# Patient Record
Sex: Female | Born: 1955 | Race: White | Hispanic: No | Smoking: Former smoker
Health system: Southern US, Community
[De-identification: ages and names within clinical notes are randomized; demographics above are authoritative.]

## PROBLEM LIST (undated history)

## (undated) DIAGNOSIS — R053 Chronic cough: Secondary | ICD-10-CM

## (undated) DIAGNOSIS — E049 Nontoxic goiter, unspecified: Secondary | ICD-10-CM

## (undated) DIAGNOSIS — N3090 Cystitis, unspecified without hematuria: Secondary | ICD-10-CM

## (undated) DIAGNOSIS — K219 Gastro-esophageal reflux disease without esophagitis: Secondary | ICD-10-CM

## (undated) HISTORY — DX: Cystitis, unspecified without hematuria: N30.90

## (undated) HISTORY — PX: NO PAST SURGERIES: SHX2092

## (undated) HISTORY — DX: Chronic cough: R05.3

## (undated) HISTORY — DX: Nontoxic goiter, unspecified: E04.9

## (undated) HISTORY — DX: Gastro-esophageal reflux disease without esophagitis: K21.9

---

## 2010-07-29 ENCOUNTER — Encounter (INDEPENDENT_AMBULATORY_CARE_PROVIDER_SITE_OTHER): Payer: Self-pay | Admitting: *Deleted

## 2010-12-01 NOTE — Letter (Signed)
Summary: Recall Office Visit  [facility]  [address]   [place], [place] [address]   Phone: [phone]  Fax: [phone]      July 29, 2010   MELINDA GWINNER [address] [facility] [address] [place]  [address] [birth year]   Dear Ms. Whittaker,   We have made several attempts to reach you by phone but have not been able to do so.   At your convenience, please call [phone] to schedule an office visit. If you have any questions, concerns, or feel that this letter is in error, we would appreciate your call.   Sincerely,    Rexene Alberts  [facility] Ph: [phone]   Fax: [phone]

## 2011-02-08 ENCOUNTER — Emergency Department (HOSPITAL_COMMUNITY): Payer: Self-pay

## 2011-02-08 ENCOUNTER — Emergency Department (HOSPITAL_COMMUNITY)
Admission: EM | Admit: 2011-02-08 | Discharge: 2011-02-08 | Disposition: A | Discharge status: Home / Self Care | Payer: Self-pay | Attending: Emergency Medicine | Admitting: Emergency Medicine

## 2011-02-08 DIAGNOSIS — M533 Sacrococcygeal disorders, not elsewhere classified: Secondary | ICD-10-CM | POA: Insufficient documentation

## 2011-02-08 DIAGNOSIS — R300 Dysuria: Secondary | ICD-10-CM | POA: Insufficient documentation

## 2011-02-08 LAB — URINALYSIS, ROUTINE W REFLEX MICROSCOPIC
Glucose, UA: NEGATIVE mg/dL
Hgb urine dipstick: NEGATIVE
Ketones, ur: NEGATIVE mg/dL
Protein, ur: NEGATIVE mg/dL
pH: 5.5 (ref 5.0–8.0)

## 2011-02-10 ENCOUNTER — Encounter: Payer: Self-pay | Admitting: Gastroenterology

## 2011-02-10 ENCOUNTER — Ambulatory Visit (INDEPENDENT_AMBULATORY_CARE_PROVIDER_SITE_OTHER): Payer: Self-pay | Admitting: Gastroenterology

## 2011-02-10 VITALS — BP 144/92 | HR 78 | Temp 98.5°F | Ht 65.0 in | Wt 180.0 lb

## 2011-02-10 DIAGNOSIS — R109 Unspecified abdominal pain: Secondary | ICD-10-CM

## 2011-02-10 NOTE — Patient Instructions (Signed)
Complete CT scan; we will call you with results Complete bloodwork; we will call you with these results as well Continue High Fiber diet We highly recommend a screening colonoscopy in the near future Further recommendations after these are completed.

## 2011-02-10 NOTE — Progress Notes (Signed)
Referring Provider: Terie Purser, PA Primary Care Physician:  Terie Purser, [place], [place] Primary Gastroenterologist:  Dr. Jena Gauss   Chief Complaint  Patient presents with  . Abdominal Pain    HPI:  Jaime Owens is a 55 y.o. female here as a referral from [place], [place], secondary to abdominal pain. She reports original onset in July, lower abdominal pain, given abx for bladder infection (Cipro). Then, next episode was LLQ discomfort, constant, given Cipro and Flagyl. She noticed improvement with this. Now she states she has rare lower abdominal discomfort, but more of a sensation of "swelling" and fullness anterior to tailbone, inside. Has to sit sideways due to pain and discomfort. Described as constant, achy, and sore. Not stabbing. Afebrile. Denies any burning or itching of rectum. One remote episode of brbpr. Takes metamucil daily, has BM at least twice/day. Through all of these symptoms, no change in bowel habits. No lack of appetite, wt loss. No prior colonoscopy, and she is not interested in pursuing one at this moment.  Past Medical History  Diagnosis Date  . Bladder infection     Surgical Hx: NONE  Current Outpatient Prescriptions  Medication Sig Dispense Refill  . Acetaminophen (TYLENOL ARTHRITIS EXT RELIEF PO) Take 2 capsules by mouth as needed.        . Alfalfa TABS Take 860 tablets by mouth 2 (two) times daily.        . ALPHA LIPOIC ACID PO Take 200 mg by mouth 2 (two) times daily.        . B Complex-C (SUPER B COMPLEX PO) Take by mouth daily.        Marland Kitchen BLACK COHOSH PO Take 150 mg by mouth daily.        . ciprofloxacin (CIPRO) 500 MG tablet Take 500 mg by mouth 2 (two) times daily.        . diphenhydrAMINE (BENADRYL) 25 MG tablet Take 25 mg by mouth every 6 (six) hours as needed.        . Ginger, Zingiber officinalis, (GINGER ROOT) 550 MG CAPS Take 550 mg by mouth 4 (four) times daily.        . Ginkgo Biloba 60 MG CAPS Take 1 capsule by mouth 2 (two) times daily.         . Glucosamine-Chondroitin-MSM 375-300-250 MG TABS Take 1 capsule by mouth 3 (three) times daily.        . LUTEIN-BILBERRY PO Take by mouth 4 (four) times daily.        Marland Kitchen MILK THISTLE PO Take 1 capsule by mouth 4 (four) times daily.        . Multiple Vitamin (MULTIVITAMIN) capsule Take 2 capsules by mouth daily.        . Nettle, Urtica Dioica, (NETTLE LEAF PO) Take 1 capsule by mouth 2 (two) times daily.        . psyllium (METAMUCIL) 58.6 % powder Take 1 packet by mouth 3 (three) times daily.        . Valerian 400 MG CAPS Take 2 capsules by mouth at bedtime as needed.          Allergies as of 02/10/2011 - never reviewed  Allergen Reaction Noted  . Codeine  02/10/2011  . Flagyl (metronidazole hcl)  02/10/2011  . Penicillins  02/10/2011    Family History  Problem Relation Age of Onset  . Diabetes Father     deceased   . Diverticulosis Mother   . Colon cancer Paternal Aunt  History   Social History  . Marital Status: Divorced    Spouse Name: N/A    Number of Children: 2  . Years of Education: N/A   Occupational History  . Not on file.   Social History Main Topics  . Smoking status: Never Smoker   . Smokeless tobacco: Not on file  . Alcohol Use: No  . Drug Use: Not on file  . Sexually Active: Not on file     Review of Systems: Gen: Denies any fever, chills, sweats, anorexia, fatigue, weakness, malaise, weight loss, and sleep disorder CV: Denies chest pain, angina, palpitations, syncope, orthopnea, PND, peripheral edema, and claudication. Resp: Denies dyspnea at rest, dyspnea with exercise, cough, sputum, wheezing, coughing up blood, and pleurisy. GI: See HPI GU : Denies urinary burning, blood in urine, urinary frequency, urinary hesitancy, nocturnal urination, and urinary incontinence. MS: Denies joint pain, limitation of movement, and swelling, stiffness, low back pain, extremity pain. Denies muscle weakness, cramps, atrophy.  Derm: Denies rash, itching, dry  skin, hives, moles, warts, or unhealing ulcers.  Psych: Denies depression, anxiety, memory loss, suicidal ideation, hallucinations, paranoia, and confusion. Heme: Denies bruising, bleeding, and enlarged lymph nodes.  Physical Exam: BP 144/92  Pulse 78  Temp(Src) 98.5 F (36.9 C) (Oral)  Ht 5\' 5"  (1.651 m)  Wt 180 lb (81.647 kg)  BMI 29.95 kg/m2 General:   Alert,  Well-developed, well-nourished, pleasant and cooperative in NAD Head:  Normocephalic and atraumatic. Eyes:  Sclera clear, no icterus.   Conjunctiva pink. Ears:  Normal auditory acuity. Nose:  No deformity, discharge,  or lesions. Mouth:  No deformity or lesions, dentition normal. Neck:  Supple; no masses or thyromegaly. Lungs:  Clear throughout to auscultation.   No wheezes, crackles, or rhonchi. No acute distress. Heart:  Regular rate and rhythm; no murmurs, clicks, rubs,  or gallops. Abdomen:  Soft, nontender and nondistended. No masses, hepatosplenomegaly or hernias noted. Normal bowel sounds, without guarding, and without rebound.   Msk:  Symmetrical without gross deformities. Normal posture. Extremities:  Without clubbing or edema. Neurologic:  Alert and  oriented x4;  grossly normal neurologically. Skin:  Intact without significant lesions or rashes. Cervical Nodes:  No significant cervical adenopathy. Psych:  Alert and cooperative. Normal mood and affect.

## 2011-02-10 NOTE — Assessment & Plan Note (Addendum)
55 year old Caucasian female, very pleasant and quite healthy, who presents with vague abdominal and "tailbone" discomfort. Originally, lower abdominal and LLQ discomfort that was treated as bladder infection originally, then upon second occurrence, as possible diverticulitis. Resolved with po abx. Pt now c/o tailbone discomfort, inability to sit for long periods of time without discomfort. Not described as rectally, more internally. Remote episode of brbpr. No change in bowel habits. Afebrile. May very well have had episode of diverticulitis in past that was not radiologically documented; however, symptoms now vague and not congruent with a repeat episode. Pt has never had a colonoscopy, and she does not want to pursue this at this time. She was counseled on the importance, but she would still rather abstain for now. I discussed obtaining a CBC to assess for any anemia, leukocytosis, as well as CT to assess for possible etiology. She is agreeable to this. Further rec's to follow.   CBC CT abd/pelvis Pt would benefit from screening colonoscopy in near future

## 2011-02-11 ENCOUNTER — Ambulatory Visit (HOSPITAL_COMMUNITY)
Admission: RE | Admit: 2011-02-11 | Discharge: 2011-02-11 | Disposition: A | Discharge status: Home / Self Care | Payer: Self-pay | Source: Ambulatory Visit | Attending: Gastroenterology | Admitting: Gastroenterology

## 2011-02-11 ENCOUNTER — Inpatient Hospital Stay (HOSPITAL_COMMUNITY): Admission: RE | Admit: 2011-02-11 | Payer: Self-pay | Source: Ambulatory Visit

## 2011-02-11 ENCOUNTER — Encounter (HOSPITAL_COMMUNITY): Payer: Self-pay

## 2011-02-11 DIAGNOSIS — R109 Unspecified abdominal pain: Secondary | ICD-10-CM | POA: Insufficient documentation

## 2011-02-11 DIAGNOSIS — K7689 Other specified diseases of liver: Secondary | ICD-10-CM | POA: Insufficient documentation

## 2011-02-11 DIAGNOSIS — K5730 Diverticulosis of large intestine without perforation or abscess without bleeding: Secondary | ICD-10-CM | POA: Insufficient documentation

## 2011-02-11 LAB — CBC WITH DIFFERENTIAL/PLATELET
Basophils Absolute: 0 10*3/uL (ref 0.0–0.1)
Eosinophils Relative: 2 % (ref 0–5)
HCT: 43.9 % (ref 36.0–46.0)
Hemoglobin: 14.2 g/dL (ref 12.0–15.0)
Lymphocytes Relative: 40 % (ref 12–46)
MCV: 89.6 fL (ref 78.0–100.0)
Monocytes Absolute: 0.5 10*3/uL (ref 0.1–1.0)
Monocytes Relative: 7 % (ref 3–12)
RDW: 12.7 % (ref 11.5–15.5)
WBC: 7 10*3/uL (ref 4.0–10.5)

## 2011-02-11 MED ORDER — IOHEXOL 300 MG/ML  SOLN
100.0000 mL | Freq: Once | INTRAMUSCULAR | Status: AC | PRN
  Administered 2011-02-11: 100 mL via INTRAVENOUS

## 2011-02-11 NOTE — Progress Notes (Signed)
Reviewed by R. Michael Hershel Corkery, MD FACP FACG 

## 2011-02-11 NOTE — Progress Notes (Signed)
Reviewed by R. Michael Shiza Thelen, MD FACP FACG 

## 2011-02-16 ENCOUNTER — Other Ambulatory Visit: Payer: Self-pay | Admitting: Gastroenterology

## 2011-02-16 ENCOUNTER — Telehealth: Payer: Self-pay

## 2011-02-16 DIAGNOSIS — R109 Unspecified abdominal pain: Secondary | ICD-10-CM

## 2011-02-16 DIAGNOSIS — K769 Liver disease, unspecified: Secondary | ICD-10-CM

## 2011-02-16 MED ORDER — PEG 3350-KCL-NA BICARB-NACL 420 G PO SOLR: ORAL | Status: AC

## 2011-02-16 NOTE — Telephone Encounter (Signed)
Pt returned call today. She has agreed to have MRI and TCS done per [facility] orders. Please call pt to set up.

## 2011-02-16 NOTE — Progress Notes (Signed)
Pt is scheduled for MRI- 02/18/11 and TCS on 03/02/11- She is aware of both.

## 2011-02-17 NOTE — Progress Notes (Signed)
Rx called to [facility] @ [facility] .

## 2011-02-19 ENCOUNTER — Ambulatory Visit (HOSPITAL_COMMUNITY)
Admission: RE | Admit: 2011-02-19 | Discharge: 2011-02-19 | Disposition: A | Discharge status: Home / Self Care | Payer: Self-pay | Source: Ambulatory Visit | Attending: Gastroenterology | Admitting: Gastroenterology

## 2011-02-19 DIAGNOSIS — K769 Liver disease, unspecified: Secondary | ICD-10-CM

## 2011-02-19 DIAGNOSIS — K7689 Other specified diseases of liver: Secondary | ICD-10-CM | POA: Insufficient documentation

## 2011-02-19 MED ORDER — GADOBENATE DIMEGLUMINE 529 MG/ML IV SOLN: 17.0000 mL | Freq: Once | INTRAVENOUS | Status: AC | PRN

## 2011-02-25 NOTE — Progress Notes (Signed)
Reminder in epic to repeat MRI in one year

## 2011-03-01 ENCOUNTER — Encounter: Payer: Self-pay | Admitting: Gastroenterology

## 2011-03-02 ENCOUNTER — Encounter: Payer: Self-pay | Admitting: Gastroenterology

## 2011-03-02 ENCOUNTER — Ambulatory Visit (HOSPITAL_COMMUNITY)
Admission: RE | Admit: 2011-03-02 | Discharge: 2011-03-02 | Disposition: A | Discharge status: Home / Self Care | Payer: Self-pay | Source: Ambulatory Visit | Attending: Gastroenterology | Admitting: Gastroenterology

## 2011-03-02 DIAGNOSIS — R1032 Left lower quadrant pain: Secondary | ICD-10-CM

## 2011-03-02 DIAGNOSIS — K648 Other hemorrhoids: Secondary | ICD-10-CM | POA: Insufficient documentation

## 2011-03-18 ENCOUNTER — Telehealth: Payer: Self-pay

## 2011-03-18 NOTE — Telephone Encounter (Signed)
Pt called to say Thanks to Dr. Darrick Penna for sending her the info she requested. She said she reviewed the operative report and would like to let Dr. Darrick Penna know that there were a few errors in the report.  1. In indication for procedure she presented with left lower quadrant abdominal pain and tailbone pain...not the elbow pain. 2. Regarding procedure technique, she was not sedated. She refused, she wanted to know what was going on.   Said she just wanted these notes added to her chart.

## 2011-03-19 NOTE — Telephone Encounter (Signed)
Please call pt. She has a preliminary report and all reports are reviewed for accuracy because they go through a transcription service. Her report has not been signed yet.

## 2011-03-25 NOTE — Telephone Encounter (Signed)
LMOM for a call.  

## 2011-03-26 NOTE — Telephone Encounter (Signed)
Pt was informed that she got a preliminary report, she would like to know when she needs to go back to get the corrected report. Please advise!

## 2011-03-30 NOTE — Telephone Encounter (Signed)
LMOM to call.

## 2011-03-30 NOTE — Op Note (Signed)
  Jaime Owens, Jaime Owens              ACCOUNT NO.:  [id number]  MEDICAL RECORD NO.:  [id number]           PATIENT TYPE:  O  LOCATION:  DAYP                          FACILITY:  [facility]  PHYSICIAN:  Jonette Eva, M.D.     DATE OF BIRTH:  1956-09-01  DATE OF PROCEDURE:  03/02/2011 DATE OF DISCHARGE:                              OPERATIVE REPORT   REFERRING PHYSICIAN:  Terie Purser, [facility].  PROCEDURE:  Colonoscopy.  INDICATION FOR EXAM:  Ms. Raben is a 55 year old female who presents for average risk colon cancer screening.  FINDINGS: 1. Normal colon without evidence of polyps, masses, inflammatory     changes, diverticula, or AVMs.  An 11-minute cecal withdrawal time. 2. Moderate internal hemorrhoids.  Otherwise, normal retroflex view of     the rectum.  RECOMMENDATIONS: 1. Screening colonoscopy in 10 years. 2. She should follow a high-fiber diet.  She was given a handout on     high-fiber diet and hemorrhoids.  MEDICATIONS:  None per the patient's request.  PROCEDURE TECHNIQUE:  Physical exam was performed.  Informed consent was obtained from the patient after explaining the benefits, risks, and alternatives to the procedure.  The patient was connected to the monitor and placed in the left lateral position.  Continuous oxygen was provided by nasal cannula and  NO IV medicine was administered through an indwelling cannula.  After rectal exam, the patient's rectum was intubated and the scope was advanced under direct visualization to the cecum.  The scope was removed slowly by carefully examining the color, texture, anatomy, and integrity of the mucosa on the way out. The patient was recovered in an endoscopy and discharged home in satisfactory condition.     Jonette Eva, M.D.     SF/MEDQ  D:  03/02/2011  T:  03/03/2011  Job:  [id number]  cc:   Terie Purser [facility]  Electronically Signed by Jonette Eva M.D. on 03/30/2011  02:47:26 PM

## 2011-03-30 NOTE — Telephone Encounter (Signed)
Please call pt. Signed off on note today.

## 2011-03-31 NOTE — Telephone Encounter (Signed)
LMOM that pt should be able to pick up corrected copy.

## 2011-08-12 IMAGING — CT CT ABD-PELV W/ CM
2 of 4 series · 16 of 46 positions shown, 18 images · IV contrast (Omnipaque 300)
Comparison: None

CLINICAL DATA: Abdominal pain, history diverticulosis, pain and
swelling at the sacrococcygeal region

CT ABDOMEN AND PELVIS WITH CONTRAST
TECHNIQUE: Multidetector CT imaging of the abdomen and pelvis was
performed following the standard protocol during bolus
administration of intravenous contrast. Sagittal and coronal MPR
images reconstructed from axial data set.
Contrast: Dilute oral contrast and 100 ml Omnipaque 300 IV

[Series 2: abd_pel_with 5.0 b40f · axial · 0.69mm/px · z∈[-453,-33]mm · 13 of 94 slices shown, 15 images]
[im 5/94  soft-tissue]
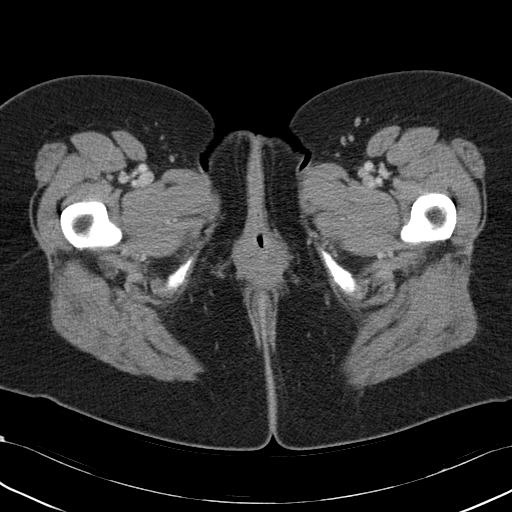
[im 5/94  bone]
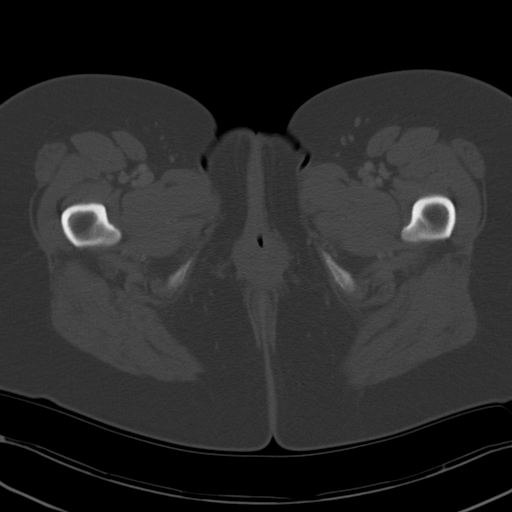
[im 14/94  soft-tissue]
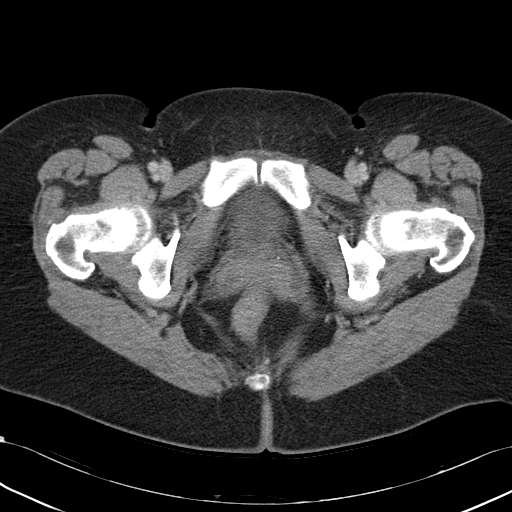
[im 19/94  soft-tissue]
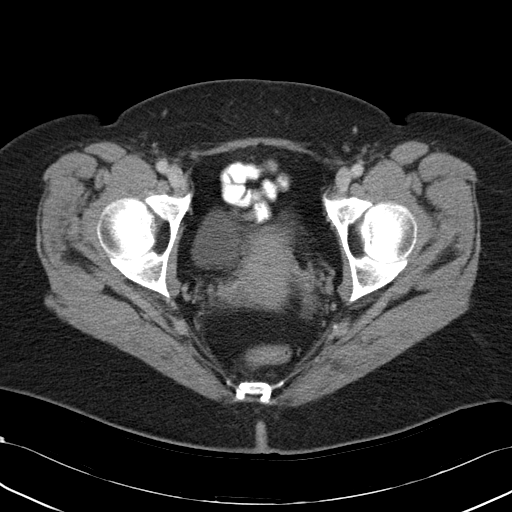
[im 28/94  soft-tissue]
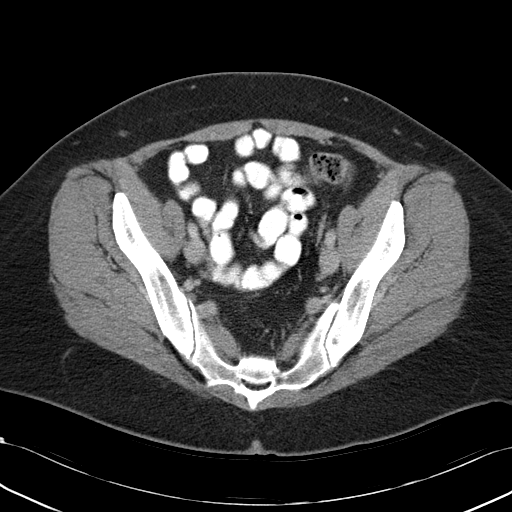
[im 33/94  soft-tissue]
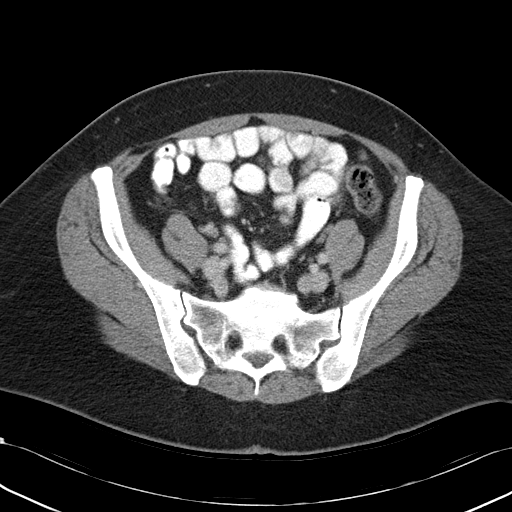
[im 42/94  soft-tissue]
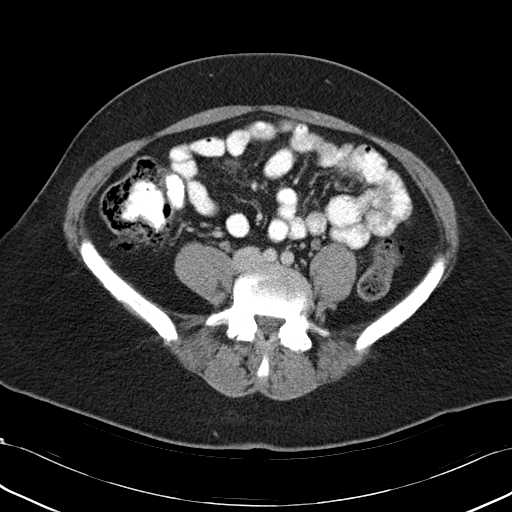
[im 47/94  soft-tissue]
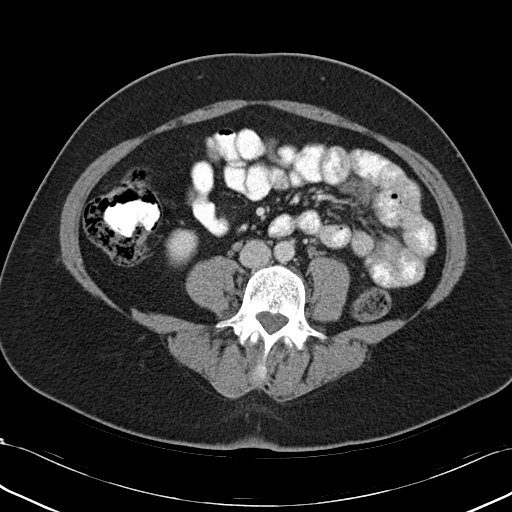
[im 52/94  soft-tissue]
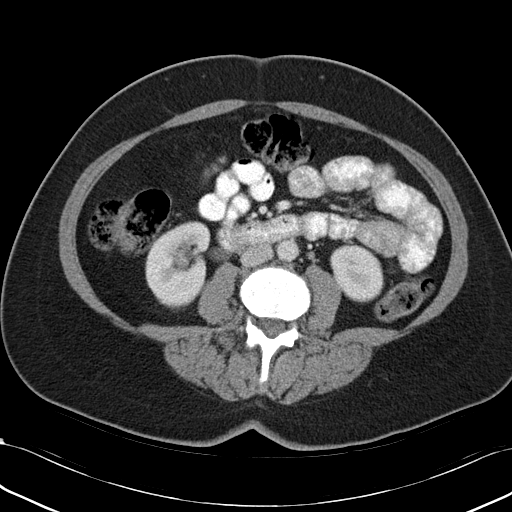
[im 61/94  soft-tissue]
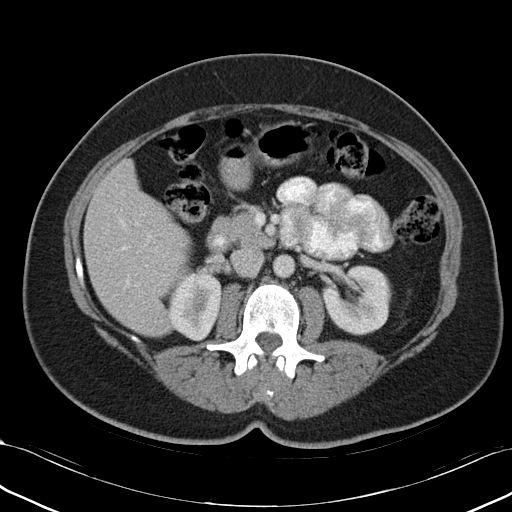
[im 61/94  bone]
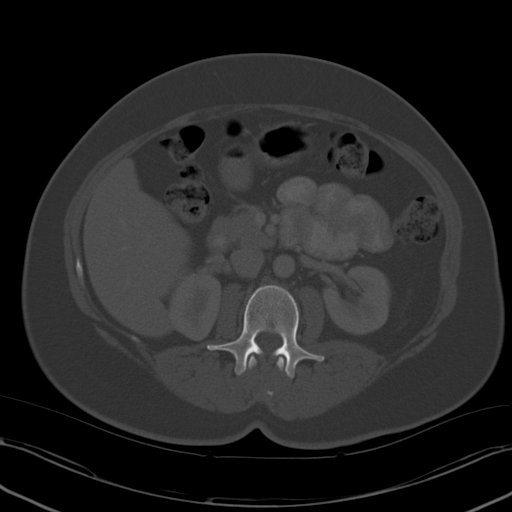
[im 66/94  soft-tissue]
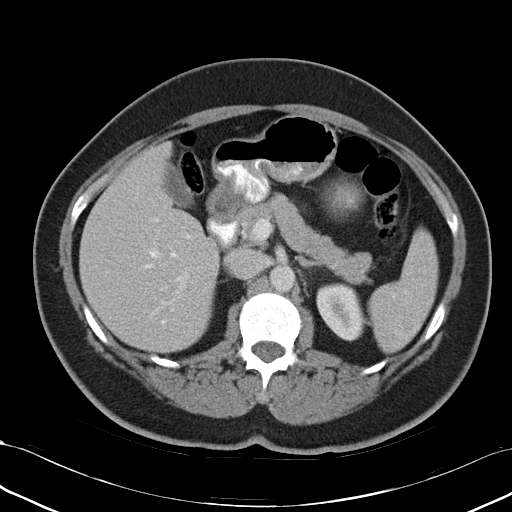
[im 75/94  soft-tissue]
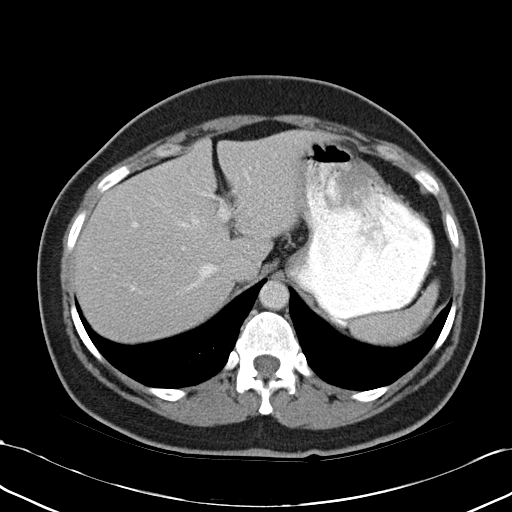
[im 80/94  soft-tissue]
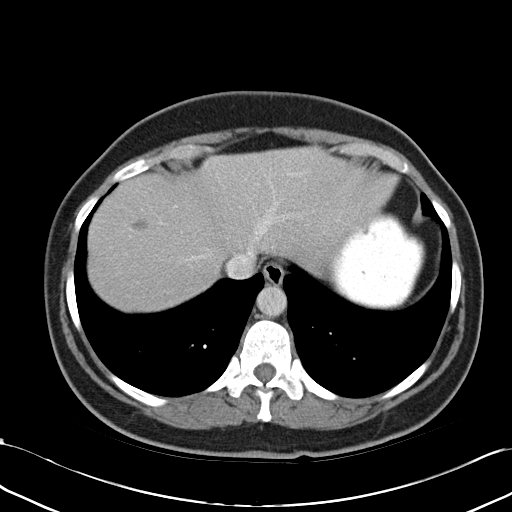
[im 89/94  soft-tissue]
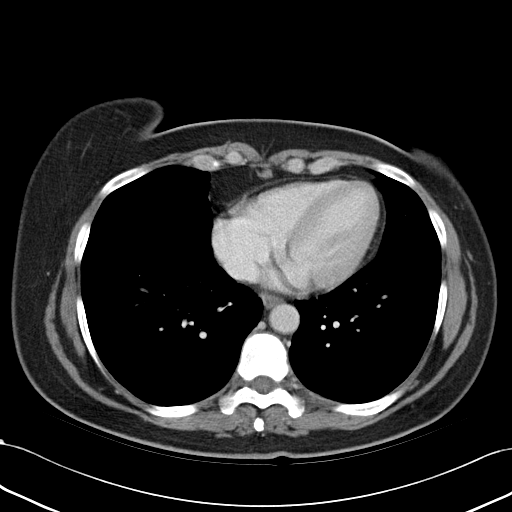

[Series 4: abd_pel_with 3.0 spo cor · coronal · 0.72mm/px · 3 of 85 slices shown]
[im 29/85  soft-tissue]
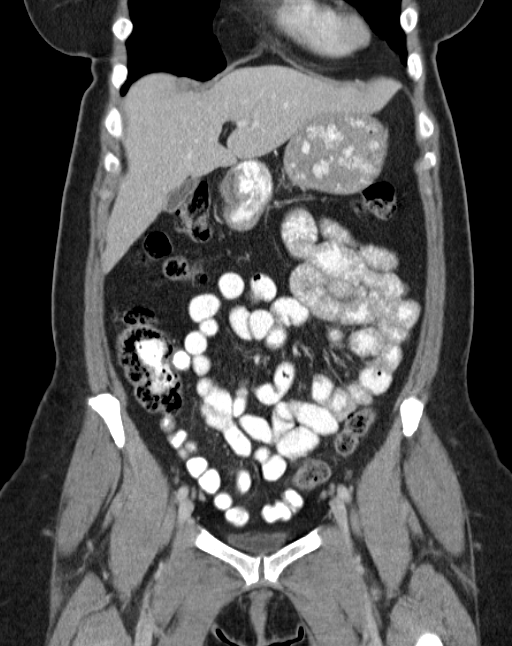
[im 38/85  soft-tissue]
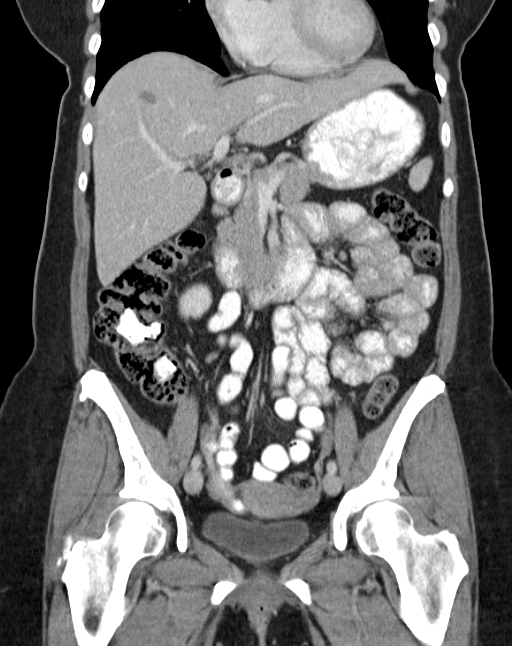
[im 47/85  soft-tissue]
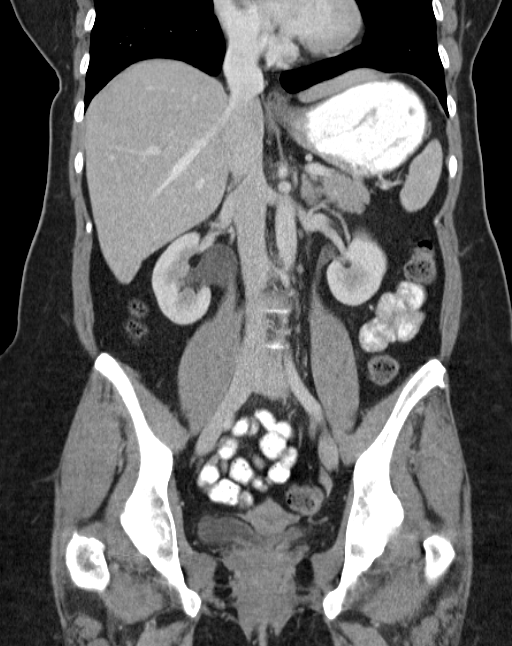

[16 of 46 positions shown; findings below may reference images not displayed]

FINDINGS: Lung bases clear.
Nonspecific low attenuation foci within liver, largest right lobe
13 x 8 mm image 15.
Remainder of liver, spleen, pancreas, and adrenal glands normal.
Symmetric nephrograms with extrarenal pelves bilaterally.
No definite urinary tract calcification or dilatation.
Normal appendix.
Few scattered pelvic phleboliths.
Unremarkable bladder, uterus and adnexae.
Tiny umbilical hernia containing fat.
Minimal sigmoid diverticulosis.
Stomach and bowel loops otherwise unremarkable.
No mass, adenopathy, free fluid, or inflammatory process.
Bones unremarkable.
No focal mass or inflammatory process identified in the
sacrococcygeal region.
IMPRESSION: Two nonspecific lesions within the liver, largest 13 x 8 mm.
In the absence of prior studies to demonstrate stability, recommend
characterization of these lesions by MR imaging of the abdomen with
and without contrast.
Tiny umbilical hernia.
Minimal sigmoid diverticulosis.
No acute inflammatory process.

## 2012-04-16 ENCOUNTER — Encounter (HOSPITAL_COMMUNITY): Payer: Self-pay | Admitting: Emergency Medicine

## 2012-04-16 ENCOUNTER — Emergency Department (HOSPITAL_COMMUNITY)
Admission: EM | Admit: 2012-04-16 | Discharge: 2012-04-16 | Disposition: A | Discharge status: Home / Self Care | Payer: Self-pay | Attending: Emergency Medicine | Admitting: Emergency Medicine

## 2012-04-16 DIAGNOSIS — M549 Dorsalgia, unspecified: Secondary | ICD-10-CM | POA: Insufficient documentation

## 2012-04-16 DIAGNOSIS — R300 Dysuria: Secondary | ICD-10-CM

## 2012-04-16 DIAGNOSIS — Z881 Allergy status to other antibiotic agents status: Secondary | ICD-10-CM | POA: Insufficient documentation

## 2012-04-16 DIAGNOSIS — Z880 Allergy status to penicillin: Secondary | ICD-10-CM | POA: Insufficient documentation

## 2012-04-16 DIAGNOSIS — Z87891 Personal history of nicotine dependence: Secondary | ICD-10-CM | POA: Insufficient documentation

## 2012-04-16 LAB — URINALYSIS, ROUTINE W REFLEX MICROSCOPIC
Bilirubin Urine: NEGATIVE
Glucose, UA: NEGATIVE mg/dL
Specific Gravity, Urine: 1.01 (ref 1.005–1.030)
Urobilinogen, UA: 0.2 mg/dL (ref 0.0–1.0)
pH: 6 (ref 5.0–8.0)

## 2012-04-16 LAB — URINE MICROSCOPIC-ADD ON

## 2012-04-16 MED ORDER — CIPROFLOXACIN HCL 500 MG PO TABS: 500.0000 mg | ORAL_TABLET | Freq: Two times a day (BID) | ORAL | Status: AC

## 2012-04-16 NOTE — ED Provider Notes (Signed)
History     CSN: [id number]  Arrival date & time 04/16/12  1456   First MD Initiated Contact with Patient 04/16/12 1515      Chief Complaint  Patient presents with  . Urinary Tract Infection    (Consider location/radiation/quality/duration/timing/severity/associated sxs/prior treatment) HPI Comments: Jaime Owens presents with dysuria which has been present for the past 2 weeks.  She describes burning discomfort with urination and urgency despite denying increased urinary frequency.  She describes bilateral low back aching soreness,  Denies fevers,  Chills,  Vaginal discharge and changes in bowel habits.   She was given a 3 day course of cipro which she finished 5 days ago.  Her symptoms resolved,  But have returned since finishing the medicine.  She has not been able to see her pcp regarding these symptoms.  She reports frequent uti's  And todays symptoms are similar to previous infections.  Patient is a 56 y.o. female presenting with urinary tract infection. The history is provided by the patient.  Urinary Tract Infection Pertinent negatives include no abdominal pain, arthralgias, chest pain, congestion, fever, headaches, joint swelling, nausea, neck pain, numbness, rash, sore throat or weakness.    Past Medical History  Diagnosis Date  . Bladder infection     History reviewed. No pertinent past surgical history.  Family History  Problem Relation Age of Onset  . Diabetes Father     deceased   . Diverticulosis Mother   . Colon cancer Paternal Aunt   . Cancer Other   . Hypertension Other     History  Substance Use Topics  . Smoking status: Former Smoker    Types: Cigarettes  . Smokeless tobacco: Never Used  . Alcohol Use: Yes     occasionally    OB History    Grav Para Term Preterm Abortions TAB SAB Ect Mult Living   2 2 2       2       Review of Systems  Constitutional: Negative for fever.  HENT: Negative for congestion, sore throat and neck pain.   Eyes:  Negative.   Respiratory: Negative for chest tightness and shortness of breath.   Cardiovascular: Negative for chest pain.  Gastrointestinal: Negative for nausea and abdominal pain.  Genitourinary: Positive for dysuria and urgency.  Musculoskeletal: Positive for back pain. Negative for joint swelling and arthralgias.  Skin: Negative.  Negative for rash and wound.  Neurological: Negative for dizziness, weakness, light-headedness, numbness and headaches.  Hematological: Negative.   Psychiatric/Behavioral: Negative.     Allergies  Codeine; Flagyl; and Penicillins  Home Medications   Current Outpatient Rx  Name Route Sig Dispense Refill  . TYLENOL ARTHRITIS EXT RELIEF PO Oral Take 2 capsules by mouth as needed.      Marland Kitchen ALFALFA PO TABS Oral Take 860 tablets by mouth 2 (two) times daily.      . ALPHA LIPOIC ACID PO Oral Take 200 mg by mouth 2 (two) times daily.      . SUPER B COMPLEX PO Oral Take by mouth daily.      Marland Kitchen BLACK COHOSH PO Oral Take 150 mg by mouth daily.      Marland Kitchen CIPROFLOXACIN HCL 500 MG PO TABS Oral Take 500 mg by mouth 2 (two) times daily.      Marland Kitchen CIPROFLOXACIN HCL 500 MG PO TABS Oral Take 1 tablet (500 mg total) by mouth 2 (two) times daily. 14 tablet 0  . DIPHENHYDRAMINE HCL 25 MG PO TABS Oral Take 25  mg by mouth every 6 (six) hours as needed.      Marland Kitchen GINGER ROOT 550 MG PO CAPS Oral Take 550 mg by mouth 4 (four) times daily.      Marland Kitchen GINKGO BILOBA 60 MG PO CAPS Oral Take 1 capsule by mouth 2 (two) times daily.      Marland Kitchen GLUCOSAMINE-CHONDROITIN-MSM 375-300-250 MG PO TABS Oral Take 1 capsule by mouth 3 (three) times daily.      . LUTEIN-BILBERRY PO Oral Take by mouth 4 (four) times daily.      Marland Kitchen MILK THISTLE PO Oral Take 1 capsule by mouth 4 (four) times daily.      . MULTIVITAMINS PO CAPS Oral Take 2 capsules by mouth daily.      Marland Kitchen NETTLE LEAF PO Oral Take 1 capsule by mouth 2 (two) times daily.      . PSYLLIUM 58.6 % PO POWD Oral Take 1 packet by mouth 3 (three) times daily.      Marland Kitchen  VALERIAN 400 MG PO CAPS Oral Take 2 capsules by mouth at bedtime as needed.        BP 165/89  Pulse 96  Temp 97.6 F (36.4 C) (Oral)  Resp 16  Ht 5\' 5"  (1.651 m)  Wt 180 lb (81.647 kg)  BMI 29.95 kg/m2  SpO2 98%  Physical Exam  Nursing note and vitals reviewed. Constitutional: She appears well-developed and well-nourished.  HENT:  Head: Normocephalic and atraumatic.  Eyes: Conjunctivae are normal.  Neck: Normal range of motion.  Cardiovascular: Normal rate, regular rhythm, normal heart sounds and intact distal pulses.   Pulmonary/Chest: Effort normal and breath sounds normal.  Abdominal: Soft. Bowel sounds are normal. There is no tenderness.  Musculoskeletal: Normal range of motion.  Neurological: She is alert.  Skin: Skin is warm and dry.  Psychiatric: She has a normal mood and affect.    ED Course  Procedures (including critical care time)  Labs Reviewed  URINALYSIS, ROUTINE W REFLEX MICROSCOPIC - Abnormal; Notable for the following:    Hgb urine dipstick TRACE (*)     All other components within normal limits  URINE MICROSCOPIC-ADD ON - Abnormal; Notable for the following:    Squamous Epithelial / LPF FEW (*)     Bacteria, UA FEW (*)     All other components within normal limits  URINE CULTURE   No results found.   1. Dysuria       MDM  Urine culture sent.  UA unremarkable, but will extend cipro course to 7 day course given sx suggestive of uti.  Encouraged increased fluids,  F/u with pcp.  Pt with no h/o kidney stones,  No vaginal discharge to suggest std or vaginal source of sx.  Pt does have a history of diverticulosis,  Without diverticulitis.  Did discuss with pt slight possibility her sx could be from diverticulitis causing bladder irrritation,  However,  Pt is afebrile,  No nausea,  Vomiting.  No llq pain on exam,  Unlikely,  But cautioned to get rechecked for any worsened sx including fever,  Nausea,  Increased pain.        Burgess Amor,  PA 04/16/12 2200

## 2012-04-16 NOTE — ED Notes (Signed)
Patient with no complaints at this time. Respirations even and unlabored. Skin warm/dry. Discharge instructions reviewed with patient at this time. Patient given opportunity to voice concerns/ask questions. Patient discharged at this time and left Emergency Department with steady gait.   

## 2012-04-16 NOTE — ED Notes (Signed)
Patient given prescription from Dr Sherwood Gambler for UTI on June 8th, took Cipro for 3 days. Per patient still has dysuria and lower back.

## 2012-04-16 NOTE — Discharge Instructions (Signed)

## 2012-04-17 NOTE — ED Provider Notes (Signed)
Medical screening examination/treatment/procedure(s) were performed by non-physician practitioner and as supervising physician I was immediately available for consultation/collaboration.   Laray Anger, DO 04/17/12 0725

## 2012-04-19 LAB — URINE CULTURE

## 2014-09-02 ENCOUNTER — Encounter (HOSPITAL_COMMUNITY): Payer: Self-pay | Admitting: Emergency Medicine

## 2015-03-25 ENCOUNTER — Other Ambulatory Visit (HOSPITAL_COMMUNITY): Payer: Self-pay | Admitting: Physician Assistant

## 2015-03-25 DIAGNOSIS — R220 Localized swelling, mass and lump, head: Secondary | ICD-10-CM

## 2015-03-25 DIAGNOSIS — K219 Gastro-esophageal reflux disease without esophagitis: Secondary | ICD-10-CM

## 2015-03-25 DIAGNOSIS — R221 Localized swelling, mass and lump, neck: Principal | ICD-10-CM

## 2015-03-27 ENCOUNTER — Ambulatory Visit (HOSPITAL_COMMUNITY)
Admission: RE | Admit: 2015-03-27 | Discharge: 2015-03-27 | Disposition: A | Discharge status: Home / Self Care | Payer: Self-pay | Source: Ambulatory Visit | Attending: Physician Assistant | Admitting: Physician Assistant

## 2015-03-27 DIAGNOSIS — R221 Localized swelling, mass and lump, neck: Secondary | ICD-10-CM | POA: Insufficient documentation

## 2015-03-27 DIAGNOSIS — K219 Gastro-esophageal reflux disease without esophagitis: Secondary | ICD-10-CM

## 2015-03-27 DIAGNOSIS — R220 Localized swelling, mass and lump, head: Secondary | ICD-10-CM

## 2015-04-14 ENCOUNTER — Other Ambulatory Visit: Payer: Self-pay | Admitting: Internal Medicine

## 2015-04-14 DIAGNOSIS — E041 Nontoxic single thyroid nodule: Secondary | ICD-10-CM

## 2015-05-01 ENCOUNTER — Other Ambulatory Visit: Payer: Self-pay

## 2015-09-25 IMAGING — US US SOFT TISSUE HEAD/NECK
1 series · 13 of 25 positions shown · non-contrast
Comparison: None.

CLINICAL DATA: 59-year-old female with neck swelling and history of
right-sided thyroid nodule which was biopsied 30 years ago.

EXAM:
THYROID ULTRASOUND
TECHNIQUE: Ultrasound examination of the thyroid gland and adjacent soft
tissues was performed.

[Series 1: us soft tissue head/neck · 0.05mm/px · 13 of 76 slices shown]
[im 1/76]
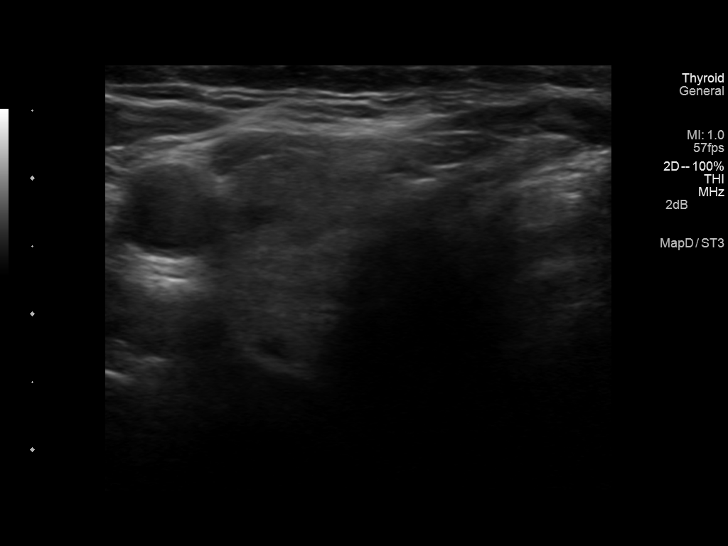
[im 7/76]
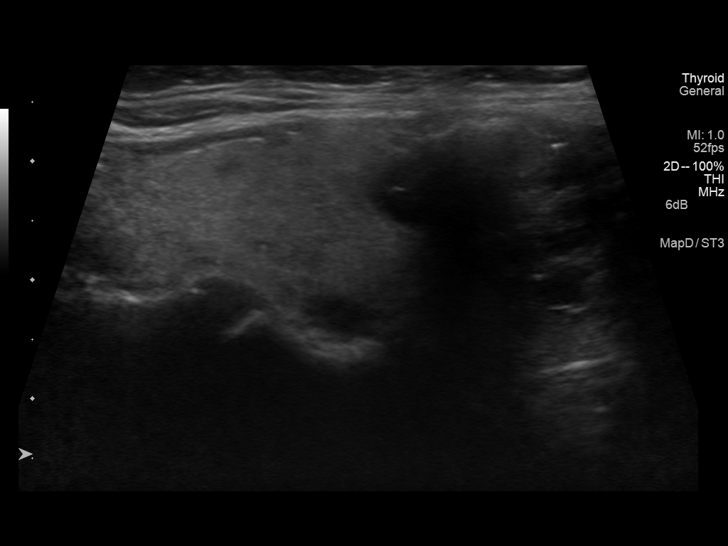
[im 13/76]
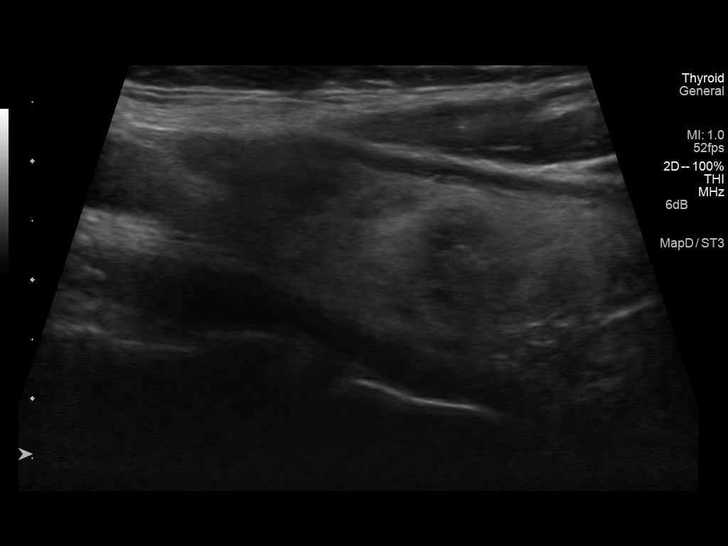
[im 19/76]
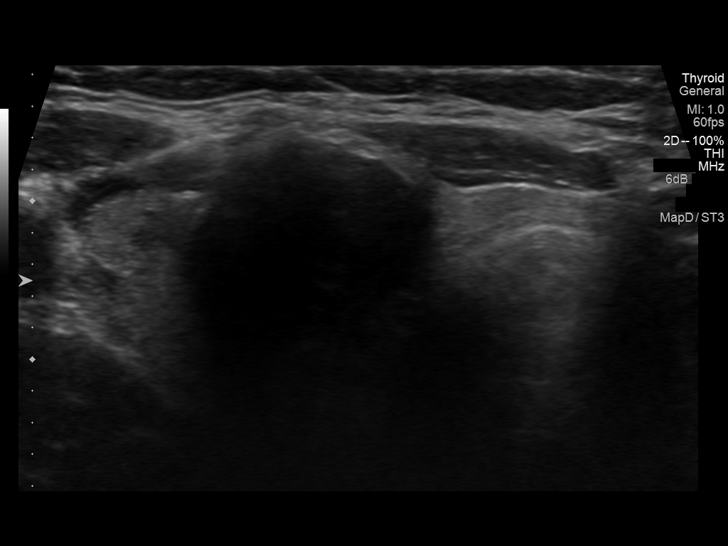
[im 26/76]
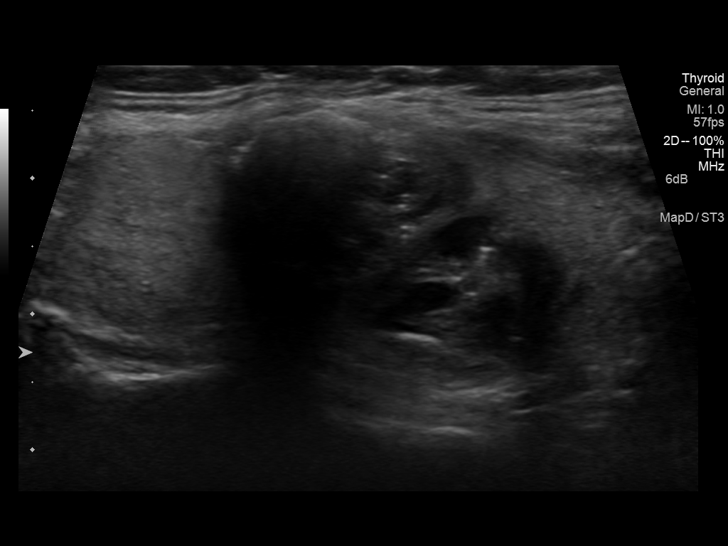
[im 32/76]
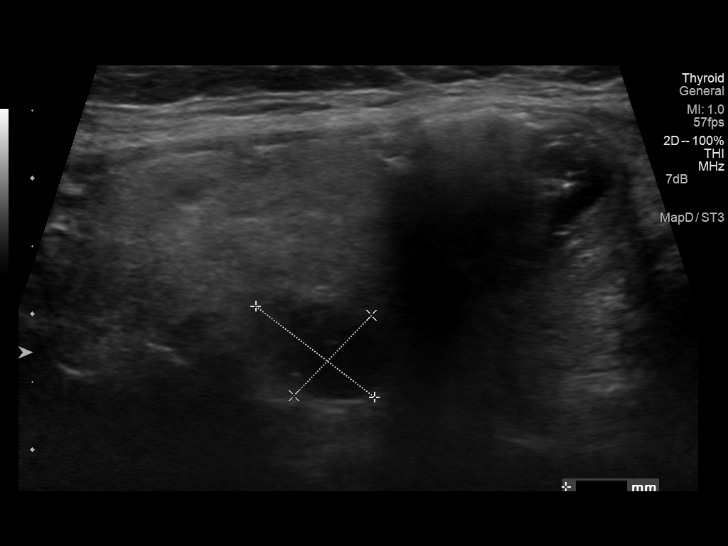
[im 38/76]
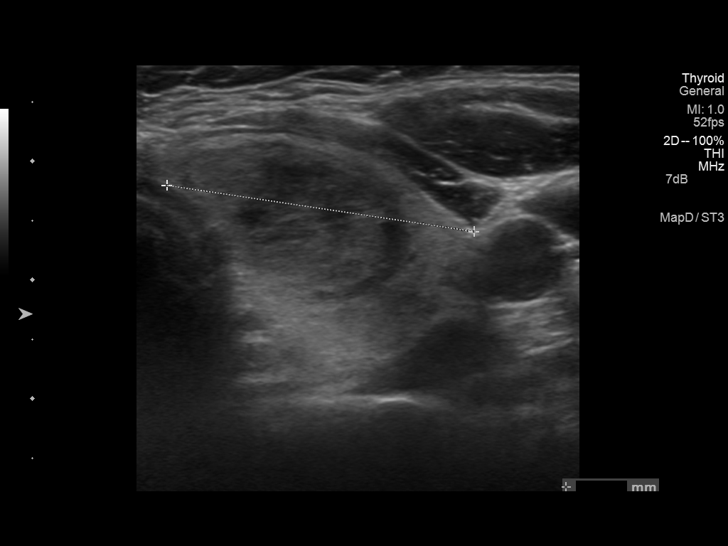
[im 44/76]
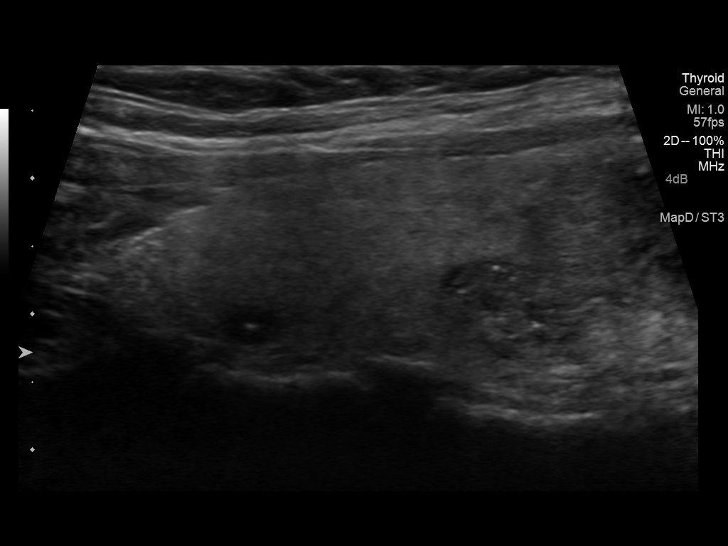
[im 51/76]
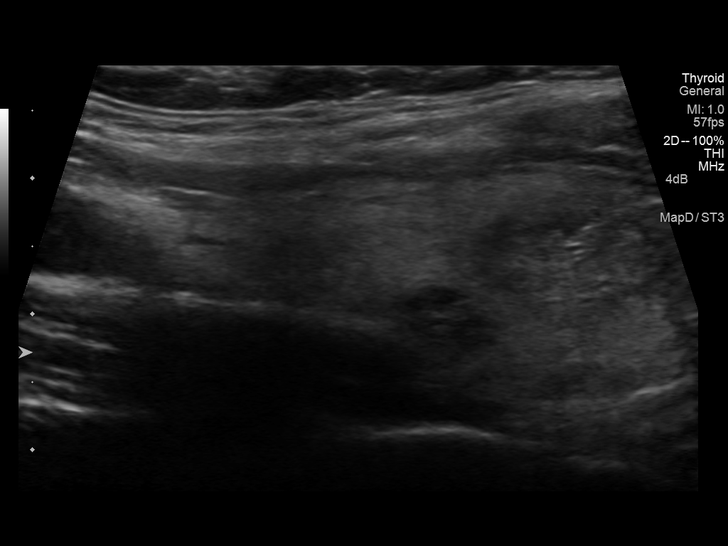
[im 57/76]
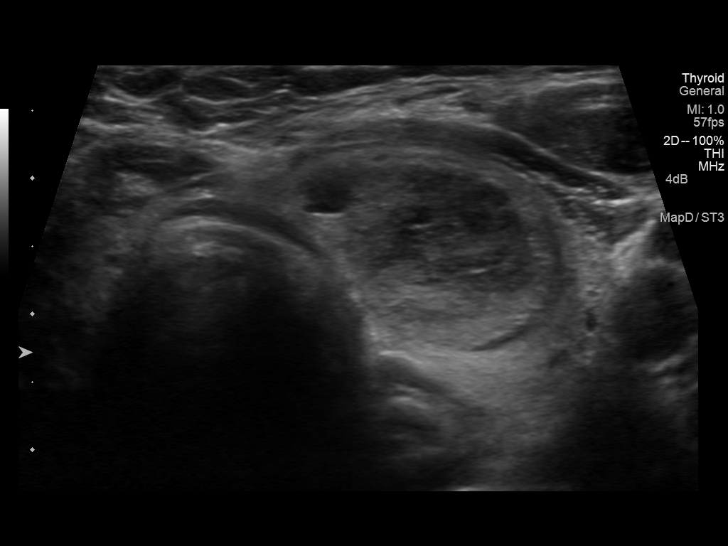
[im 63/76]
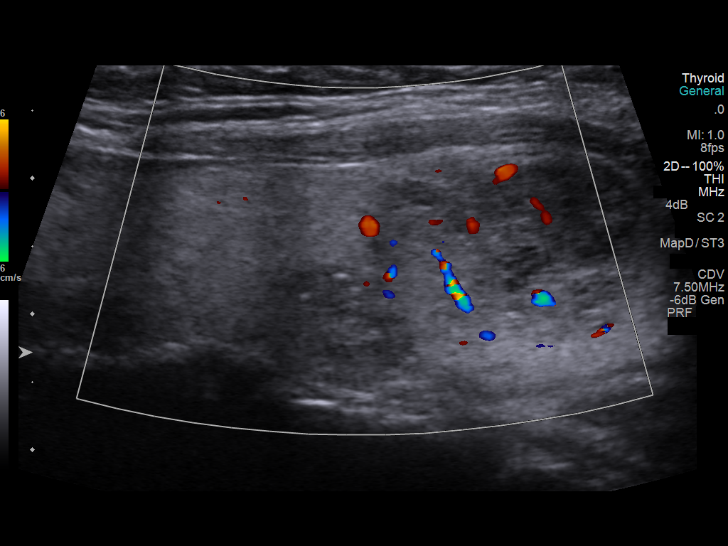
[im 69/76]
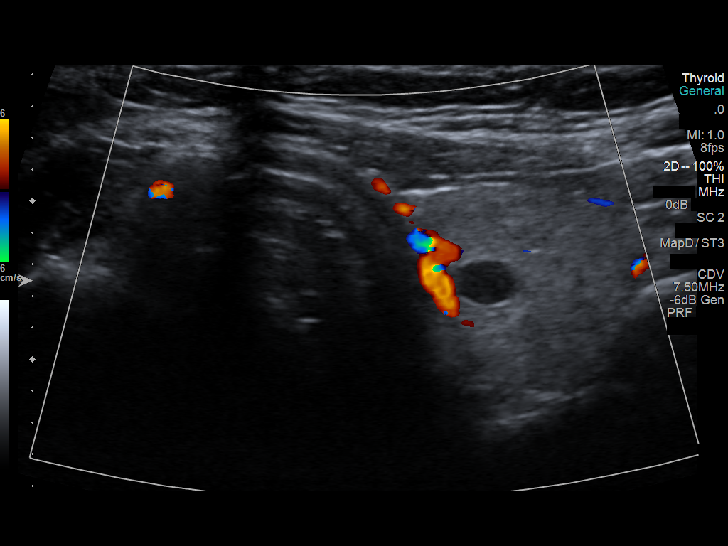
[im 76/76]
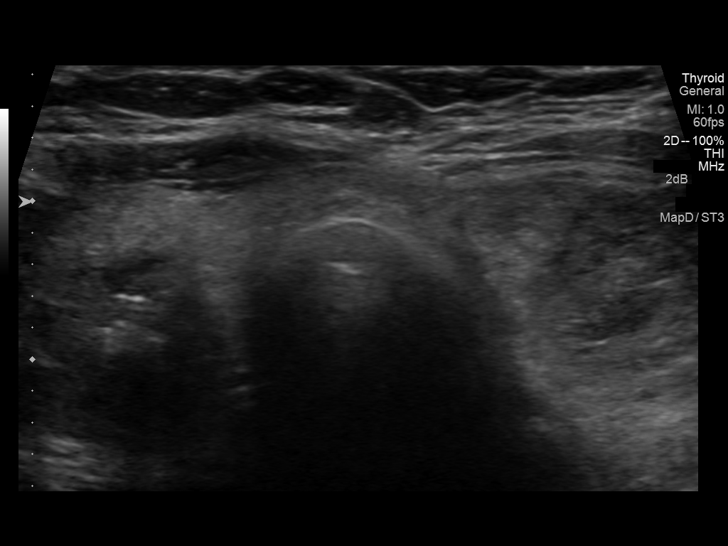

[13 of 25 positions shown; findings below may reference images not displayed]

FINDINGS: Right thyroid lobe

Measurements: 5.4 x 1.9 x 1.8 cm.  Multiple nodules as follows:

1. Heterogeneous predominantly solid nodule with peripheral
calcification in the midpole measures 20 x 12 x 16 mm.
2. Heterogeneous predominantly solid nodule in the lower pole
measures 18 x 12 x 14 mm.
3. Hypoechoic cyst in the upper pole measures 8 x 6 x 6 mm.
4. Mixed cystic and solid nodule in the medial aspect of the mid
gland measures 11 x 8 x 9 mm.
Left thyroid lobe

Measurements: 5.0 x 1.7 x 2.6 cm.  Multiple nodules as follows:

1. Heterogeneous solid nodule in the mid to lower gland measures 23
x 15 x 23 mm.
2. Adjacent heterogeneous slightly hypoechoic nodule abutting the
larger nodule in the mid gland measures 11 x 7 x 13 mm.
3. Small hypoechoic cyst in the upper pole measures 5 x 3 x 4 mm.
Isthmus

Thickness: 0.3 cm.  No nodules visualized.

Lymphadenopathy

None visualized.
IMPRESSION: 1. Multiple bilateral thyroid nodules most consistent with
multinodular goiter.
2. Nodules bilaterally technically meet consensus criteria for FNA
biopsy. Consider biopsy of the largest, dominant nodule in each
lobe. Findings meet consensus criteria for biopsy. Ultrasound-guided
fine needle aspiration should be considered, as per the consensus
statement: Management of Thyroid Nodules Detected at US: Society of
Radiologists in Ultrasound Consensus Conference Statement. Radiology

## 2017-12-14 ENCOUNTER — Other Ambulatory Visit (HOSPITAL_COMMUNITY): Payer: Self-pay | Admitting: Family Medicine

## 2017-12-14 DIAGNOSIS — R221 Localized swelling, mass and lump, neck: Secondary | ICD-10-CM

## 2017-12-22 ENCOUNTER — Encounter (INDEPENDENT_AMBULATORY_CARE_PROVIDER_SITE_OTHER): Payer: Self-pay

## 2017-12-22 ENCOUNTER — Ambulatory Visit: Payer: Self-pay | Admitting: General Surgery

## 2017-12-22 ENCOUNTER — Encounter: Payer: Self-pay | Admitting: General Surgery

## 2017-12-22 VITALS — BP 155/84 | HR 87 | Temp 98.7°F | Resp 18 | Ht 65.0 in | Wt 184.0 lb

## 2017-12-22 DIAGNOSIS — L723 Sebaceous cyst: Secondary | ICD-10-CM

## 2017-12-22 NOTE — Progress Notes (Signed)
[facility] History and Physical  Reason for Referral: Cyst on the chest  Referring Physician:  Samantha Jackson PA      Chief Complaint    Mass      Jaime Owens is a 61 y.o. female.  HPI: Jaime Owens is a otherwise healthy 61 yo who comes to clinic with complaints of a cyst/ mass on her sternum that has been there for over 1 year and has been getting larger. It was a "blackhead" initially and she popped it, and it has since that time continued to get larger.  She has never had a mammogram, but did undergo an ultrasound of the area ordered by Ms. Jackson. She is self pay and says that she only gets what she must get.  She has had a colonoscopy in the past with benign findings and repeat due at 10 years.  She says she did this without any sedation.   The patient has no history of any masses, lumps, bumps, nipple changes or discharge. She had menarche at age 14, and her first pregnancy at age 24. She is G2P2. She has no history of any family breast cancer.  She underwent menopause at 51.  She has never had any previous biopsies and no prior mammograms because she cannot afford the mammogram.      Past Medical History:  Diagnosis Date  . Bladder infection          Past Surgical History:  Procedure Laterality Date  . NO PAST SURGERIES           Family History  Problem Relation Age of Onset  . Diabetes Father        deceased   . Diverticulosis Mother   . Colon cancer Paternal Aunt   . Cancer Other   . Hypertension Other     Social History        Tobacco Use  . Smoking status: Former Smoker    Types: Cigarettes  . Smokeless tobacco: Never Used  Substance Use Topics  . Alcohol use: Yes    Comment: occasionally  . Drug use: No    Medications: I have reviewed the patient's current medications. .pta     Allergies as of 12/22/2017      Reactions   Codeine    Flagyl [metronidazole Hcl]    Penicillins               Medication List             Accurate as of 12/22/17  4:56 PM. Always use your most recent med list.           Alfalfa Tabs Take 860 tablets by mouth 2 (two) times daily.   ALPHA LIPOIC ACID PO Take 200 mg by mouth 2 (two) times daily.   BLACK COHOSH PO Take 150 mg by mouth daily.   ciprofloxacin 500 MG tablet Commonly known as:  CIPRO Take 500 mg by mouth 2 (two) times daily.   diphenhydrAMINE 25 MG tablet Commonly known as:  BENADRYL Take 25 mg by mouth every 6 (six) hours as needed.   Ginger Root 550 MG Caps Take 550 mg by mouth 4 (four) times daily.   Ginkgo Biloba 60 MG Caps Take 1 capsule by mouth 2 (two) times daily.   Glucosamine-Chondroitin-MSM 375-300-250 MG Tabs Take 1 capsule by mouth 3 (three) times daily.   LUTEIN-BILBERRY PO Take by mouth 4 (four) times daily.   MILK THISTLE PO Take 1 capsule by   mouth 4 (four) times daily.   multivitamin capsule Take 2 capsules by mouth daily.   NETTLE LEAF PO Take 1 capsule by mouth 2 (two) times daily.   psyllium 58.6 % powder Commonly known as:  METAMUCIL Take 1 packet by mouth 3 (three) times daily.   SUPER B COMPLEX PO Take by mouth daily.   TYLENOL ARTHRITIS EXT RELIEF PO Take 2 capsules by mouth as needed.   Valerian 400 MG Caps Take 2 capsules by mouth at bedtime as needed.        ROS:  A comprehensive review of systems was negative except for: Eyes: positive for floaters in eye Musculoskeletal: positive for back pain, bone pain and neck pain Endocrine: positive for tired/ sluggish Allergic/Immunologic: positive for hay fever  Blood pressure (!) 155/84, pulse 87, temperature 98.7 F (37.1 C), resp. rate 18, height 5' 5" (1.651 m), weight 184 lb (83.5 kg). Physical Exam  Constitutional: She is well-developed, well-nourished, and in no distress.  HENT:  Head: Normocephalic.  Eyes: Pupils are equal, round, and reactive to light.  Neck: Normal  range of motion.  Cardiovascular: Normal rate and regular rhythm.  Pulmonary/Chest: Effort normal. She exhibits mass. Right breast exhibits no inverted nipple, no mass, no nipple discharge, no skin change and no tenderness. Left breast exhibits no inverted nipple, no mass, no nipple discharge, no skin change and no tenderness.  Central chest over sternum, 2cm area with central pit, tender, mobile, between the breast  Abdominal: Soft. She exhibits no distension. There is no tenderness.  Lymphadenopathy:    She has no cervical adenopathy.    She has no axillary adenopathy.  Neurological: She is alert.  Skin: Skin is warm and dry.  Psychiatric: Mood, memory, affect and judgment normal.  Vitals reviewed.   Results: US with 2.2 cm heterogenous area that is either a mass versus cyst     Assessment & Plan:  Jaime Owens is a 61 y.o. female with mass in the central portion of her chest over her sternum. It sounds and looks like a sebaceous cyst but the [place] is concerning that it could be something more. I did not feel any masses in her breast or axilla. I have discussed with her the recommendation of a mammogram but given her self pay she does not have the resources for this at this time.   -Her risk assessment for breast cancer is:  -Risk calculator breast cancer - 5 yr risk 1.3% and lifetime risk 6.6% -We have discussed that this could be a cyst, or that this could be something more worrisome like a lymph node from cancer, and that we would send the tissue to pathology to confirm the diagnosis.  -She is adamant about doing this under local anesthesia without any pain or sedation medications and says that she has undergone multiple biopsies of thyroid nodules and the colonoscopy without any sedation  -I have warned her that using only local puts us at risk of incompletely removing the tissue which could cause a recurrence, but that I am willing to try   All questions were answered to the  satisfaction of the patient.  The risk and benefits of cyst/ mass removal from the chest wall were discussed including but not limited to bleeding, infection, incomplete removal/ recurrence, risk that it is something malignant and will require additional workup and surgeries.  After careful consideration, Jaime Owens has decided to proceed.    Krystina Strieter C Damare Serano 12/22/2017, 4:56 PM      

## 2017-12-22 NOTE — Patient Instructions (Signed)
Epidermal Cyst An epidermal cyst is a small, painless lump under your skin. It may be called an epidermal inclusion cyst or an infundibular cyst. The cyst contains a grayish-white, bad-smelling substance (keratin). It is important not to pop epidermal cysts yourself. These cysts are usually harmless (benign), but they can get infected. Symptoms of infection may include:  Redness.  Inflammation.  Tenderness.  Warmth.  Fever.  A grayish-white, bad-smelling substance draining from the cyst.  Pus draining from the cyst.  Follow these instructions at home:  Take over-the-counter and prescription medicines only as told by your doctor.  If you were prescribed an antibiotic, use it as told by your doctor. Do not stop using the antibiotic even if you start to feel better.  Keep the area around your cyst clean and dry.  Wear loose, dry clothing.  Do not try to pop your cyst.  Avoid touching your cyst.  Check your cyst every day for signs of infection.  Keep all follow-up visits as told by your doctor. This is important. How is this prevented?  Wear clean, dry, clothing.  Avoid wearing tight clothing.  Keep your skin clean and dry. Shower or take baths every day.  Wash your body with a benzoyl peroxide wash when you shower or bathe. Contact a health care provider if:  Your cyst has symptoms of infection.  Your condition is not improving or is getting worse.  You have a cyst that looks different from other cysts you have had.  You have a fever. Get help right away if:  Redness spreads from the cyst into the surrounding area. This information is not intended to replace advice given to you by your health care provider. Make sure you discuss any questions you have with your health care provider. Document Released: 11/25/2004 Document Revised: 06/16/2016 Document Reviewed: 08/20/2015 Elsevier Interactive Patient Education  2018 [place], Female Breast  cancer is an abnormal growth of tissue (tumor) in the breast that is cancerous (malignant). Unlike noncancerous (benign) tumors, malignant tumors can spread to other parts of your body. The most common type of female breast cancer begins in the milk ducts (ductal carcinoma). Breast cancer is one of the most common types of cancer in women. What are the causes? The exact cause of female breast cancer is unknown. What increases the risk?  Age older than 37 years.  Family history of breast cancer.  Having the BRCA1 and BRCA2 genes.  Personal history of radiation exposure.  Obesity.  Menstrual periods that begin before age 75 years.  Menopause that begins after age 3 years.  Pregnant for the first time at the age of 65 years or older.  Using hormone therapy.  Drinking more than one alcoholic drink per day. What are the signs or symptoms?  A painless lump in your breast.  Changes in the size or shape of your breast.  Breast skin changes, such as puckering or dimpling.  Nipple abnormalities, such as scaling, crustiness, redness, or pulling in (retraction).  Nipple discharge that is bloody or clear. How is this diagnosed? Your health care provider will ask about your medical history. He or she may also perform a number of procedures, such as:  A physical exam. This will involve feeling the tissue around the breast and under the arms.  Taking a sample of nipple discharge. The sample will be examined under a microscope.  Breast X-rays (mammogram), breast ultrasound exams, or an MRI.  Taking a tissue sample (biopsy)  from the breast. The sample will be examined under a microscope to look for cancer cells.  Your cancer will be staged to determine its severity and extent. Staging is a careful attempt to find out the size of the tumor, whether the cancer has spread, and if so, to what parts of the body. You may need to have more tests to determine the stage of your cancer:  Stage  0-The tumor has not spread to other breast tissue.  Stage I-The cancer is only found in the breast. The tumor may be up to  in (2 cm) wide.  Stage II-The cancer has spread to nearby lymph nodes. The tumor may be up to 2 in (5 cm) wide.  Stage III-The cancer has spread to more distant lymph nodes. The tumor may be larger than 2 in (5 cm) wide.  Stage IV-The cancer has spread to other parts of the body, such as the bones, brain, liver, or lungs.  How is this treated? Depending on the type and stage, female breast cancer may be treated with one or more of the following therapies:  Surgery to remove just the tumor (lumpectomy) or the entire breast (mastectomy). Lymph nodes may also be removed.  Radiation therapy, which uses high-energy rays to kill cancer cells.  Chemotherapy, which is the use of drugs to kill cancer cells.  Hormone therapy, which involves taking medicine to adjust the hormone levels in your body. You may take medicine to decrease your estrogen levels. This can help stop cancer cells from growing.  Follow these instructions at home:  Take medicines only as directed by your health care provider.  Maintain a healthy diet.  Consider joining a support group. This may help you learn to cope with the stress of having breast cancer.  Keep all follow-up appointments as directed by your health care provider. Contact a health care provider if:  You have a sudden increase in pain.  You notice a new lump in either breast or under your arm.  You develop swelling in either arm or hand.  You lose weight without trying.  You have a fever.  You notice new fatigue or weakness. Get help right away if:  You have chest pain or trouble breathing.  You faint. This information is not intended to replace advice given to you by your health care provider. Make sure you discuss any questions you have with your health care provider. Document Released: 01/26/2006 Document Revised:  02/26/2016 Document Reviewed: 12/12/2013 Elsevier Interactive Patient Education  2017 [employer].

## 2017-12-23 NOTE — H&P (Signed)
[facility] History and Physical  Reason for Referral: Cyst on the chest  Referring Physician:  Terie Purser PA      Chief Complaint    Mass      Jaime Owens is a 62 y.o. female.  HPI: [name] is a otherwise healthy 62 yo who comes to clinic with complaints of a cyst/ mass on her sternum that has been there for over 1 year and has been getting larger. It was a "blackhead" initially and she popped it, and it has since that time continued to get larger.  She has never had a mammogram, but did undergo an ultrasound of the area ordered by [name]. She is self pay and says that she only gets what she must get.  She has had a colonoscopy in the past with benign findings and repeat due at 10 years.  She says she did this without any sedation.   The patient has no history of any masses, lumps, bumps, nipple changes or discharge. She had menarche at age 59, and her first pregnancy at age 51. She is G2P2. She has no history of any family breast cancer.  She underwent menopause at 54.  She has never had any previous biopsies and no prior mammograms because she cannot afford the mammogram.      Past Medical History:  Diagnosis Date  . Bladder infection          Past Surgical History:  Procedure Laterality Date  . NO PAST SURGERIES           Family History  Problem Relation Age of Onset  . Diabetes Father        deceased   . Diverticulosis Mother   . Colon cancer Paternal Aunt   . Cancer Other   . Hypertension Other     Social History        Tobacco Use  . Smoking status: Former Smoker    Types: Cigarettes  . Smokeless tobacco: Never Used  Substance Use Topics  . Alcohol use: Yes    Comment: occasionally  . Drug use: No    Medications: I have reviewed the patient's current medications. .pta     Allergies as of 12/22/2017      Reactions   Codeine    Flagyl [metronidazole Hcl]    Penicillins               Medication List             Accurate as of 12/22/17  4:56 PM. Always use your most recent med list.           Alfalfa Tabs Take 860 tablets by mouth 2 (two) times daily.   ALPHA LIPOIC ACID PO Take 200 mg by mouth 2 (two) times daily.   BLACK COHOSH PO Take 150 mg by mouth daily.   ciprofloxacin 500 MG tablet Commonly known as:  CIPRO Take 500 mg by mouth 2 (two) times daily.   diphenhydrAMINE 25 MG tablet Commonly known as:  BENADRYL Take 25 mg by mouth every 6 (six) hours as needed.   Ginger Root 550 MG Caps Take 550 mg by mouth 4 (four) times daily.   Ginkgo Biloba 60 MG Caps Take 1 capsule by mouth 2 (two) times daily.   Glucosamine-Chondroitin-MSM 375-300-250 MG Tabs Take 1 capsule by mouth 3 (three) times daily.   LUTEIN-BILBERRY PO Take by mouth 4 (four) times daily.   MILK THISTLE PO Take 1 capsule by  mouth 4 (four) times daily.   multivitamin capsule Take 2 capsules by mouth daily.   NETTLE LEAF PO Take 1 capsule by mouth 2 (two) times daily.   psyllium 58.6 % powder Commonly known as:  METAMUCIL Take 1 packet by mouth 3 (three) times daily.   SUPER B COMPLEX PO Take by mouth daily.   TYLENOL ARTHRITIS EXT RELIEF PO Take 2 capsules by mouth as needed.   Valerian 400 MG Caps Take 2 capsules by mouth at bedtime as needed.        ROS:  A comprehensive review of systems was negative except for: Eyes: positive for floaters in eye Musculoskeletal: positive for back pain, bone pain and neck pain Endocrine: positive for tired/ sluggish Allergic/Immunologic: positive for hay fever  Blood pressure (!) 155/84, pulse 87, temperature 98.7 F (37.1 C), resp. rate 18, height 5\' 5"  (1.651 m), weight 184 lb (83.5 kg). Physical Exam  Constitutional: She is well-developed, well-nourished, and in no distress.  HENT:  Head: Normocephalic.  Eyes: Pupils are equal, round, and reactive to light.  Neck: Normal  range of motion.  Cardiovascular: Normal rate and regular rhythm.  Pulmonary/Chest: Effort normal. She exhibits mass. Right breast exhibits no inverted nipple, no mass, no nipple discharge, no skin change and no tenderness. Left breast exhibits no inverted nipple, no mass, no nipple discharge, no skin change and no tenderness.  Central chest over sternum, 2cm area with central pit, tender, mobile, between the breast  Abdominal: Soft. She exhibits no distension. There is no tenderness.  Lymphadenopathy:    She has no cervical adenopathy.    She has no axillary adenopathy.  Neurological: She is alert.  Skin: Skin is warm and dry.  Psychiatric: Mood, memory, affect and judgment normal.  Vitals reviewed.   Results: [name] with 2.2 cm heterogenous area that is either a mass versus cyst     Assessment & Plan:  Jaime Owens is a 62 y.o. female with mass in the central portion of her chest over her sternum. It sounds and looks like a sebaceous cyst but the [name] is concerning that it could be something more. I did not feel any masses in her breast or axilla. I have discussed with her the recommendation of a mammogram but given her self pay she does not have the resources for this at this time.   -Her risk assessment for breast cancer is:  -Risk calculator breast cancer - 5 yr risk 1.3% and lifetime risk 6.6% -We have discussed that this could be a cyst, or that this could be something more worrisome like a lymph node from cancer, and that we would send the tissue to pathology to confirm the diagnosis.  -She is adamant about doing this under local anesthesia without any pain or sedation medications and says that she has undergone multiple biopsies of thyroid nodules and the colonoscopy without any sedation  -I have warned her that using only local puts [name] at risk of incompletely removing the tissue which could cause a recurrence, but that I am willing to try   All questions were answered to the  satisfaction of the patient.  The risk and benefits of cyst/ mass removal from the chest wall were discussed including but not limited to bleeding, infection, incomplete removal/ recurrence, risk that it is something malignant and will require additional workup and surgeries.  After careful consideration, [name] has decided to proceed.    Lucretia Roers 12/22/2017, 4:56 PM

## 2017-12-28 ENCOUNTER — Ambulatory Visit: Admit: 2017-12-28 | Payer: Self-pay | Admitting: General Surgery

## 2017-12-28 SURGERY — CYST REMOVAL
Anesthesia: LOCAL

## 2020-09-20 NOTE — Progress Notes (Signed)
Cardiology Office Note:    Date:  09/22/2020   ID:  Jaime Owens, DOB 06/24/1956, MRN [id number]  PCP:  Ladon Applebaum  [facility] Cardiologist:  No primary care provider on file.  [facility] Electrophysiologist:  None   Referring MD: Avis Epley, PA*    History of Present Illness:    Jaime Owens is a 64 y.o. female with no significant PMH who was referred by Terie Purser, PA-C for further evaluation due to chest pain and family history of CAD.  Patient has been having substernal/epigastric chest pain that has been ongoing for several years. Patient states that she has a twinge of pain/squeezing that occurs several times per day. Pain can occur at rest or with exertion. Pain not worsened with exertion. Notices it more when she is nervous. Does not wake her up at night but notices it when she lays down to rest. Each episodes a couple of seconds and then goes away. No prolonged episodes. Has not taken anything for it. No known history of GERD but states she does have some issues with swallowing and the sensation of food getting stuck in her throat. Otherwise, denies any palpitations, lightheadedness, dizziness, exercise limitations, nausea, vomiting, bleeding issues. Has thyroid goiters but has not followed up with her Endocrinologist. Has not been on synthroid.  Patient is active and walks 51miles/day. Eats a healthy diet.   No HTN, HLD, or DMII. Family history: mother with CAD s/p PCI (age 14), and maternal GF with PPM. No known family history of early cardiac disease.   Past Medical History:  Diagnosis Date   Bladder infection     Past Surgical History:  Procedure Laterality Date   NO PAST SURGERIES      Current Medications: Current Meds  Medication Sig   acetaminophen (TYLENOL) 650 MG CR tablet Take 650-1,300 mg by mouth every 8 (eight) hours as needed for pain.   Menthol-Camphor (TIGER BALM ARTHRITIS RUB EX) Apply 1 application topically  daily as needed (for pain).   Multiple Vitamin (MULTIVITAMIN) capsule Take 1 capsule by mouth 2 (two) times daily.    Probiotic Product (PROBIOTIC PO) Take 1 capsule by mouth daily.   triamcinolone (KENALOG) 0.1 % Apply 1 application topically 2 (two) times daily as needed.     Allergies:   Codeine, Flagyl [metronidazole hcl], and Penicillins   Social History   Socioeconomic History   Marital status: Widowed    Spouse name: Not on file   Number of children: 2   Years of education: Not on file   Highest education level: Not on file  Occupational History   Not on file  Tobacco Use   Smoking status: Former Smoker    Types: Cigarettes   Smokeless tobacco: Never Used  Substance and Sexual Activity   Alcohol use: Yes    Comment: occasionally   Drug use: No   Sexual activity: Yes    Birth control/protection: Post-menopausal  Other Topics Concern   Not on file  Social History Narrative   Not on file   Social Determinants of Health   Financial Resource Strain:    Difficulty of Paying Living Expenses: Not on file  Food Insecurity:    Worried About Programme researcher, broadcasting/film/video in the Last Year: Not on file   [employer] of Food in the Last Year: Not on file  Transportation Needs:    Lack of Transportation (Medical): Not on file   Lack of Transportation (Non-Medical): Not on file  Physical Activity:    Days of Exercise per Week: Not on file   Minutes of Exercise per Session: Not on file  Stress:    Feeling of Stress : Not on file  Social Connections:    Frequency of Communication with Friends and Family: Not on file   Frequency of Social Gatherings with Friends and Family: Not on file   Attends Religious Services: Not on file   Active Member of Clubs or Organizations: Not on file   Attends BankerClub or Organization Meetings: Not on file   Marital Status: Not on file     Family History: The patient's family history includes Cancer in an other family member;  Colon cancer in her paternal aunt; Diabetes in her father; Diverticulosis in her mother; Heart disease in her maternal grandmother and mother; Hypertension in an other family member.  ROS:   Please see the history of present illness.    Review of Systems  Constitutional: Negative for chills and fever.  HENT: Negative for hearing loss.   Eyes: Negative for blurred vision.  Respiratory: Negative for shortness of breath.   Cardiovascular: Positive for chest pain. Negative for palpitations, orthopnea, claudication, leg swelling and PND.  Gastrointestinal: Negative for abdominal pain, heartburn, melena, nausea and vomiting.  Genitourinary: Negative for hematuria.  Musculoskeletal: Negative for falls.  Neurological: Negative for dizziness and loss of consciousness.  Endo/Heme/Allergies: Negative for polydipsia.  Psychiatric/Behavioral: Negative for substance abuse.     EKGs/Labs/Other Studies Reviewed:    The following studies were reviewed today: No prior cardiac studies  EKG:  EKG is  ordered today.  The ekg ordered today demonstrates NSR with HR76; borderline poor r-wave progression. No ischemia or block.  Recent Labs: No results found for requested labs within last 8760 hours.  Recent Lipid Panel No results found for: CHOL, TRIG, HDL, CHOLHDL, VLDL, LDLCALC, LDLDIRECT   Risk Assessment/Calculations:       Physical Exam:    VS:  BP (!) 144/82    Pulse 76    Ht 5\' 5"  (1.651 m)    Wt 151 lb (68.5 kg)    BMI 25.13 kg/m     Wt Readings from Last 3 Encounters:  09/22/20 151 lb (68.5 kg)  12/22/17 184 lb (83.5 kg)  04/16/12 180 lb (81.6 kg)     GEN:  Well nourished, well developed in no acute distress HEENT: Normal NECK: No JVD; No carotid bruits CARDIAC: RRR, 1/6 systolic murmur best heard at RUSB RESPIRATORY:  Clear to auscultation without rales, wheezing or rhonchi  ABDOMEN: Soft, non-tender, non-distended MUSCULOSKELETAL:  No edema; No deformity  SKIN: Warm and  dry NEUROLOGIC:  Alert and oriented x 3 PSYCHIATRIC:  Normal affect   ASSESSMENT:    1. Chest pain of uncertain etiology   2. Goiter   3. Esophageal spasm   4. Palpitations    PLAN:    In order of problems listed above:  #Atypical Chest Pain: Patient with intermittent chest squeezing that occurs intermittently throughout the day and lasts a couple of seconds before resolving. No known triggers. Occurs at rest and with exertion but not worsened with exertion. No known HTN, HLD, DMII however patient admits that she does not follow-up regularly with her PCP. Family history notable for CAD in her mother. Suspect more GI in etiology (? Esophageal spasm) based on history. Will check coronary calcium score and TTE and refer to GI at this time. -Check coronary calcium score -TTE -Refer to GI as sounds more  typical of esophageal spasm  #Primary Prevention #Family history CAD: -Check lipid panel and HgA1C for risk stratification -Coronary calcium score as above -Discussed mediterranean diet and importance of regular exercise as detailed below  Exercise recommendations: Goal of exercising for at least 30 minutes a day, at least 5 times per week.  Please exercise to a moderate exertion.  This means that while exercising it is difficult to speak in full sentences, however you are not so short of breath that you feel you must stop, and not so comfortable that you can carry on a full conversation.  Exertion level should be approximately a 5/10, if 10 is the most exertion you can perform.  Diet recommendations: Recommend a heart healthy diet such as the Mediterranean diet.  This diet consists of plant based foods, healthy fats, lean meats, olive oil.  It suggests limiting the intake of simple carbohydrates such as white breads, pastries, and pastas.  It also limits the amount of red meat, wine, and dairy products such as cheese that one should consume on a daily basis.  #Thyroid Goiters: -Refer  back to endocrinology -Check TSH    Medication Adjustments/Labs and Tests Ordered: Current medicines are reviewed at length with the patient today.  Concerns regarding medicines are outlined above.  Orders Placed This Encounter  Procedures   CT CARDIAC SCORING   TSH   HgB A1c   Lipid Profile   Ambulatory referral to Gastroenterology   Ambulatory referral to Endocrinology   EKG 12-Lead   ECHOCARDIOGRAM COMPLETE   No orders of the defined types were placed in this encounter.   Patient Instructions  Medication Instructions:  *If you need a refill on your cardiac medications before your next appointment, please call your pharmacy*  Lab Work: Your physician recommends that you return for lab work - FASTING Lipid Panel, Hemoglobin A1C, and a TSH -- please do not have anything to eat or drink within 6 hours of having your blood work drawn  If you have labs (blood work) drawn today and your tests are completely normal, you will receive your results only by:  MyChart Message (if you have MyChart) OR  A paper copy in the mail If you have any lab test that is abnormal or we need to change your treatment, we will call you to review the results.  Testing/Procedures: Your physician has requested that you have an echocardiogram. Echocardiography is a painless test that uses sound waves to create images of your heart. It provides your doctor with information about the size and shape of your heart and how well your hearts chambers and valves are working. This procedure takes approximately one hour. There are no restrictions for this procedure.  Your physician has requested that you have cardiac CT. Cardiac computed tomography (CT) is a painless test that uses an x-ray machine to take clear, detailed pictures of your heart. For further information please visit [email or web address] Please follow instruction sheet as given.  Follow-Up: At [facility], you and your health needs are  our priority.  As part of our continuing mission to provide you with exceptional heart care, we have created designated Provider Care Teams.  These Care Teams include your primary Cardiologist (physician) and Advanced Practice Providers (APPs -  Physician Assistants and Nurse Practitioners) who all work together to provide you with the care you need, when you need it.  We recommend signing up for the patient portal called "MyChart".  Sign up information is provided on this After Visit  Summary.  MyChart is used to connect with patients for Virtual Visits (Telemedicine).  Patients are able to view lab/test results, encounter notes, upcoming appointments, etc.  Non-urgent messages can be sent to your provider as well.   To learn more about what you can do with MyChart, go to [email or web address].    Your next appointment:   Your physician recommends that you schedule a follow-up appointment in: 6 MONTHS with Dr. Jacquelyne Balint.  The format for your next appointment:   In Person with Laurance Flatten, MD        Signed, Meriam Sprague, MD  09/22/2020 12:37 PM    [facility]

## 2020-09-22 ENCOUNTER — Other Ambulatory Visit: Payer: Self-pay

## 2020-09-22 ENCOUNTER — Encounter: Payer: Self-pay | Admitting: Cardiology

## 2020-09-22 ENCOUNTER — Other Ambulatory Visit: Payer: 59

## 2020-09-22 ENCOUNTER — Ambulatory Visit (INDEPENDENT_AMBULATORY_CARE_PROVIDER_SITE_OTHER): Payer: 59 | Admitting: Cardiology

## 2020-09-22 VITALS — BP 144/82 | HR 76 | Ht 65.0 in | Wt 151.0 lb

## 2020-09-22 DIAGNOSIS — R079 Chest pain, unspecified: Secondary | ICD-10-CM | POA: Diagnosis not present

## 2020-09-22 DIAGNOSIS — R002 Palpitations: Secondary | ICD-10-CM | POA: Diagnosis not present

## 2020-09-22 DIAGNOSIS — K224 Dyskinesia of esophagus: Secondary | ICD-10-CM | POA: Diagnosis not present

## 2020-09-22 DIAGNOSIS — E049 Nontoxic goiter, unspecified: Secondary | ICD-10-CM | POA: Diagnosis not present

## 2020-09-22 NOTE — Patient Instructions (Signed)
Medication Instructions:  *If you need a refill on your cardiac medications before your next appointment, please call your pharmacy*  Lab Work: Your physician recommends that you return for lab work - FASTING Lipid Panel, Hemoglobin A1C, and a TSH -- please do not have anything to eat or drink within 6 hours of having your blood work drawn  If you have labs (blood work) drawn today and your tests are completely normal, you will receive your results only by:  MyChart Message (if you have MyChart) OR  A paper copy in the mail If you have any lab test that is abnormal or we need to change your treatment, we will call you to review the results.  Testing/Procedures: Your physician has requested that you have an echocardiogram. Echocardiography is a painless test that uses sound waves to create images of your heart. It provides your doctor with information about the size and shape of your heart and how well your hearts chambers and valves are working. This procedure takes approximately one hour. There are no restrictions for this procedure.  Your physician has requested that you have cardiac CT. Cardiac computed tomography (CT) is a painless test that uses an x-ray machine to take clear, detailed pictures of your heart. For further information please visit [email or web address] Please follow instruction sheet as given.  Follow-Up: At [facility], you and your health needs are our priority.  As part of our continuing mission to provide you with exceptional heart care, we have created designated Provider Care Teams.  These Care Teams include your primary Cardiologist (physician) and Advanced Practice Providers (APPs -  Physician Assistants and Nurse Practitioners) who all work together to provide you with the care you need, when you need it.  We recommend signing up for the patient portal called "MyChart".  Sign up information is provided on this After Visit Summary.  MyChart is used to connect with  patients for Virtual Visits (Telemedicine).  Patients are able to view lab/test results, encounter notes, upcoming appointments, etc.  Non-urgent messages can be sent to your provider as well.   To learn more about what you can do with MyChart, go to [email or web address].    Your next appointment:   Your physician recommends that you schedule a follow-up appointment in: 6 MONTHS with Dr. Jacquelyne Balint.  The format for your next appointment:   In Person with Laurance Flatten, MD

## 2020-10-21 ENCOUNTER — Other Ambulatory Visit: Payer: 59

## 2020-10-21 ENCOUNTER — Other Ambulatory Visit: Payer: Self-pay

## 2020-10-21 ENCOUNTER — Ambulatory Visit (HOSPITAL_COMMUNITY): Payer: 59 | Attending: Cardiovascular Disease

## 2020-10-21 DIAGNOSIS — R002 Palpitations: Secondary | ICD-10-CM | POA: Diagnosis not present

## 2020-10-21 DIAGNOSIS — R079 Chest pain, unspecified: Secondary | ICD-10-CM | POA: Diagnosis not present

## 2020-10-21 DIAGNOSIS — E049 Nontoxic goiter, unspecified: Secondary | ICD-10-CM

## 2020-10-21 LAB — ECHOCARDIOGRAM COMPLETE
Area-P 1/2: 3.72 cm2
S' Lateral: 2.7 cm

## 2020-10-22 LAB — HEMOGLOBIN A1C
Est. average glucose Bld gHb Est-mCnc: 120 mg/dL
Hgb A1c MFr Bld: 5.8 % — ABNORMAL HIGH (ref 4.8–5.6)

## 2020-10-22 LAB — TSH: TSH: 1.67 u[IU]/mL (ref 0.450–4.500)

## 2020-10-22 LAB — LIPID PANEL
Chol/HDL Ratio: 2.5 ratio (ref 0.0–4.4)
Cholesterol, Total: 227 mg/dL — ABNORMAL HIGH (ref 100–199)
HDL: 90 mg/dL (ref >39)
LDL Chol Calc (NIH): 118 mg/dL — ABNORMAL HIGH (ref 0–99)
Triglycerides: 113 mg/dL (ref 0–149)
VLDL Cholesterol Cal: 19 mg/dL (ref 5–40)

## 2020-11-10 ENCOUNTER — Inpatient Hospital Stay: Admission: RE | Admit: 2020-11-10 | Payer: 59 | Source: Ambulatory Visit

## 2020-12-10 ENCOUNTER — Encounter: Payer: Self-pay | Admitting: Internal Medicine

## 2020-12-10 ENCOUNTER — Ambulatory Visit: Payer: 59 | Admitting: Internal Medicine

## 2020-12-10 ENCOUNTER — Other Ambulatory Visit: Payer: Self-pay

## 2020-12-10 VITALS — BP 126/82 | HR 88 | Ht 65.0 in | Wt 155.1 lb

## 2020-12-10 DIAGNOSIS — Z8639 Personal history of other endocrine, nutritional and metabolic disease: Secondary | ICD-10-CM | POA: Diagnosis not present

## 2020-12-10 DIAGNOSIS — E042 Nontoxic multinodular goiter: Secondary | ICD-10-CM

## 2020-12-10 LAB — COMPREHENSIVE METABOLIC PANEL
ALT: 17 U/L (ref 0–35)
AST: 18 U/L (ref 0–37)
Albumin: 4.3 g/dL (ref 3.5–5.2)
Alkaline Phosphatase: 69 U/L (ref 39–117)
BUN: 12 mg/dL (ref 6–23)
CO2: 31 mEq/L (ref 19–32)
Calcium: 9.9 mg/dL (ref 8.4–10.5)
Chloride: 96 mEq/L (ref 96–112)
Creatinine, Ser: 0.76 mg/dL (ref 0.40–1.20)
GFR: 83.19 mL/min (ref >60.00)
Glucose, Bld: 89 mg/dL (ref 70–99)
Potassium: 4.6 mEq/L (ref 3.5–5.1)
Sodium: 132 mEq/L — ABNORMAL LOW (ref 135–145)
Total Bilirubin: 0.4 mg/dL (ref 0.2–1.2)
Total Protein: 7.6 g/dL (ref 6.0–8.3)

## 2020-12-10 NOTE — Progress Notes (Signed)
Name: Jaime Owens  MRN/ DOB: [id number], Aug 02, 1956    Age/ Sex: 65 y.o., female    PCP: Ladon Applebaum   Reason for Endocrinology Evaluation: MNG/Hypothyroidism     Date of Initial Endocrinology Evaluation: 12/10/2020     HPI: [name] is a 65 y.o. female with a past medical history of MNG. The patient presented for initial endocrinology clinic visit on 12/10/2020 for consultative assistance with her MNG/Hypothyroidism .   Pt has been diagnosed with MNG   At the age of 31. She had a benign FNA > 30 yrs ago. An ultrasound in 2016 revealed a left thyroid nodule at 2.3 cm and a right thyroid nodule at 2 cm.  She is S/P benign FNA of both right and left dominant thyroid nodules on 04/30/2015. She is S/P calcified left thyroid nodule 1.1  cm with benign cytology in 2018  No prior radiation exposure   Denies local neck enlargement, has occasional dysphagia  Denies GERD  Has a pending referral to GI   Denies palpitations  Follows with cariology with [facility] She had a prescription of Levothyroxine but the pt did not start    Mother with goiter    She was told in the past she had high calcium and to stop calcium, she is currently on MVI   Vitamin D3 5000 iu daily    HISTORY:  Past Medical History:  Past Medical History:  Diagnosis Date  . Bladder infection     Past Surgical History:  Past Surgical History:  Procedure Laterality Date  . NO PAST SURGERIES        Social History:  reports that she has quit smoking. Her smoking use included cigarettes. She has never used smokeless tobacco. She reports current alcohol use. She reports that she does not use drugs.  Family History: family history includes Cancer in an other family member; Colon cancer in her paternal aunt; Diabetes in her father; Diverticulosis in her mother; Heart disease in her maternal grandmother and mother; Hypertension in an other family member.   HOME MEDICATIONS: Allergies as of  12/10/2020      Reactions   Codeine Nausea Only   Flagyl [metronidazole Hcl] Other (See Comments)   Burning sensation   Penicillins Other (See Comments)   Unknown Has patient had a PCN reaction causing immediate rash, facial/tongue/throat swelling, SOB or lightheadedness with hypotension: Unknown Has patient had a PCN reaction causing severe rash involving mucus membranes or skin necrosis: Unknown Has patient had a PCN reaction that required hospitalization: Unknown Has patient had a PCN reaction occurring within the last 10 years: No If all of the above answers are "NO", then may proceed with Cephalosporin use.      Medication List       Accurate as of December 10, 2020  7:17 AM. If you have any questions, ask your nurse or doctor.        acetaminophen 650 MG CR tablet Commonly known as: TYLENOL Take 650-1,300 mg by mouth every 8 (eight) hours as needed for pain.   multivitamin capsule Take 1 capsule by mouth 2 (two) times daily.   PROBIOTIC PO Take 1 capsule by mouth daily.   TIGER BALM ARTHRITIS RUB EX Apply 1 application topically daily as needed (for pain).   triamcinolone 0.1 % Commonly known as: KENALOG Apply 1 application topically 2 (two) times daily as needed.         REVIEW OF SYSTEMS: A comprehensive ROS was  conducted with the patient and is negative except as per HPI     OBJECTIVE:  VS: BP 126/82   Pulse 88   Ht 5\' 5"  (1.651 m)   Wt 155 lb 2 oz (70.4 kg)   SpO2 98%   BMI 25.81 kg/m    Wt Readings from Last 3 Encounters:  09/22/20 151 lb (68.5 kg)  12/22/17 184 lb (83.5 kg)  04/16/12 180 lb (81.6 kg)     EXAM: General: Pt appears well and is in NAD  Neck: General: Supple without adenopathy. Thyroid: Thyroid size normal. Right nodule appreciated   Lungs: Clear with good BS bilat with no rales, rhonchi, or wheezes  Heart: Auscultation: RRR.  Abdomen: Normoactive bowel sounds, soft, nontender, without masses or organomegaly palpable   Extremities:  BL LE: No pretibial edema normal ROM and strength.  Skin: Hair: Texture and amount normal with gender appropriate distribution Skin Inspection: No rashes Skin Palpation: Skin temperature, texture, and thickness normal to palpation  Neuro: Cranial nerves: II - XII grossly intact  Motor: Normal strength throughout DTRs: 2+ and symmetric in UE without delay in relaxation phase  Mental Status: Judgment, insight: Intact Orientation: Oriented to time, place, and person Mood and affect: No depression, anxiety, or agitation     DATA REVIEWED:  Results for CRISTALLE, ROHM (MRN Suzzette Righter) as of 12/11/2020 12:29  Ref. Range 12/10/2020 11:15  Sodium Latest Ref Range: 135 - 145 mEq/L 132 (L)  Potassium Latest Ref Range: 3.5 - 5.1 mEq/L 4.6  Chloride Latest Ref Range: 96 - 112 mEq/L 96  CO2 Latest Ref Range: 19 - 32 mEq/L 31  Glucose Latest Ref Range: 70 - 99 mg/dL 89  BUN Latest Ref Range: 6 - 23 mg/dL 12  Creatinine Latest Ref Range: 0.40 - 1.20 mg/dL 02/07/2021  Calcium Latest Ref Range: 8.4 - 10.5 mg/dL 9.9  Alkaline Phosphatase Latest Ref Range: 39 - 117 U/L 69  Albumin Latest Ref Range: 3.5 - 5.2 g/dL 4.3  AST Latest Ref Range: 0 - 37 U/L 18  ALT Latest Ref Range: 0 - 35 U/L 17  Total Protein Latest Ref Range: 6.0 - 8.3 g/dL 7.6  Total Bilirubin Latest Ref Range: 0.2 - 1.2 mg/dL 0.4  GFR Latest Ref Range: >60.00 mL/min 83.19  VITD Latest Ref Range: 30.00 - 100.00 ng/mL 72.08  PTH, Intact Latest Ref Range: 14 - 64 pg/mL 24   Results for MCKINLEY, ADELSTEIN (MRN Suzzette Righter) as of 12/11/2020 12:29  Ref. Range 10/21/2020 09:23  TSH Latest Ref Range: 0.450 - 4.500 uIU/mL 1.670     FNA left thyroid nodule 01/25/2017  Negative for malignant cells, [place] Category II ,consistent with benign follicular cells.   ASSESSMENT/PLAN/RECOMMENDATIONS:   1. Multinodular Goiter :  - Pt is clinically and biochemically euthyroid  - She had an  ultrasound in 2016 revealed a left thyroid nodule  at 2.3 cm and a right thyroid nodule at 2 cm.  She is S/P benign FNA of both right and left dominant thyroid nodules on 04/30/2015. She is S/P calcified left thyroid nodule 1.1  cm with benign cytology in 2018 - Will proceed with thyroid ultrasound     2. Hx of Hypercalcemia :  - Pt reports hx of hypercalcemia in the past , apparently this was a single occurrence  - Repeat labs show normal calcium and PTH as well as vitamin D   Continue Vitamin D 5000 iu daily     F/U in 1 yr  Signed electronically by:  Abby Raelyn Mora, MD  [facility]  [facility] [address] [place] [place], [place] [address] Phone: [phone] FAX: [phone]   CC: Ladon Applebaum [address] [place] [address] Phone: [phone] Fax: [phone]   Return to Endocrinology clinic as below: Future Appointments  Date Time Provider Department Center  12/10/2020 10:30 AM Melvin Whiteford, Konrad Dolores, MD [id number] None

## 2020-12-11 LAB — PARATHYROID HORMONE, INTACT (NO CA): PTH: 24 pg/mL (ref 14–64)

## 2020-12-11 LAB — VITAMIN D 25 HYDROXY (VIT D DEFICIENCY, FRACTURES): VITD: 72.08 ng/mL (ref 30.00–100.00)

## 2020-12-11 LAB — CALCIUM, IONIZED: Calcium, Ion: 5.22 mg/dL (ref 4.8–5.6)

## 2021-01-05 ENCOUNTER — Encounter: Payer: Self-pay | Admitting: Internal Medicine

## 2021-12-10 ENCOUNTER — Ambulatory Visit: Payer: 59 | Admitting: Internal Medicine

## 2022-01-11 ENCOUNTER — Emergency Department (HOSPITAL_COMMUNITY)
Admission: EM | Admit: 2022-01-11 | Discharge: 2022-01-12 | Disposition: A | Discharge status: Home / Self Care | Payer: 59 | Attending: Emergency Medicine | Admitting: Emergency Medicine

## 2022-01-11 ENCOUNTER — Encounter (HOSPITAL_COMMUNITY): Payer: Self-pay | Admitting: Emergency Medicine

## 2022-01-11 ENCOUNTER — Emergency Department (HOSPITAL_COMMUNITY): Payer: 59

## 2022-01-11 ENCOUNTER — Other Ambulatory Visit: Payer: Self-pay

## 2022-01-11 DIAGNOSIS — K6389 Other specified diseases of intestine: Secondary | ICD-10-CM | POA: Diagnosis not present

## 2022-01-11 DIAGNOSIS — R1030 Lower abdominal pain, unspecified: Secondary | ICD-10-CM | POA: Diagnosis present

## 2022-01-11 LAB — COMPREHENSIVE METABOLIC PANEL
ALT: 16 U/L (ref 0–44)
AST: 16 U/L (ref 15–41)
Albumin: 3.9 g/dL (ref 3.5–5.0)
Alkaline Phosphatase: 64 U/L (ref 38–126)
Anion gap: 8 (ref 5–15)
BUN: 11 mg/dL (ref 8–23)
CO2: 27 mmol/L (ref 22–32)
Calcium: 9.5 mg/dL (ref 8.9–10.3)
Chloride: 96 mmol/L — ABNORMAL LOW (ref 98–111)
Creatinine, Ser: 0.72 mg/dL (ref 0.44–1.00)
GFR, Estimated: >60 mL/min (ref >60)
Glucose, Bld: 101 mg/dL — ABNORMAL HIGH (ref 70–99)
Potassium: 4.8 mmol/L (ref 3.5–5.1)
Sodium: 131 mmol/L — ABNORMAL LOW (ref 135–145)
Total Bilirubin: 0.2 mg/dL — ABNORMAL LOW (ref 0.3–1.2)
Total Protein: 7.3 g/dL (ref 6.5–8.1)

## 2022-01-11 LAB — URINALYSIS, ROUTINE W REFLEX MICROSCOPIC
Bilirubin Urine: NEGATIVE
Glucose, UA: NEGATIVE mg/dL
Hgb urine dipstick: NEGATIVE
Ketones, ur: NEGATIVE mg/dL
Leukocytes,Ua: NEGATIVE
Nitrite: NEGATIVE
Protein, ur: NEGATIVE mg/dL
Specific Gravity, Urine: 1.011 (ref 1.005–1.030)
pH: 6 (ref 5.0–8.0)

## 2022-01-11 LAB — CBC
HCT: 39.9 % (ref 36.0–46.0)
Hemoglobin: 12.9 g/dL (ref 12.0–15.0)
MCH: 29.4 pg (ref 26.0–34.0)
MCHC: 32.3 g/dL (ref 30.0–36.0)
MCV: 90.9 fL (ref 80.0–100.0)
Platelets: 277 10*3/uL (ref 150–400)
RBC: 4.39 MIL/uL (ref 3.87–5.11)
RDW: 11.8 % (ref 11.5–15.5)
WBC: 8.4 10*3/uL (ref 4.0–10.5)
nRBC: 0 % (ref 0.0–0.2)

## 2022-01-11 LAB — LIPASE, BLOOD: Lipase: 35 U/L (ref 11–51)

## 2022-01-11 MED ORDER — SODIUM CHLORIDE 0.9 % IV BOLUS: 500.0000 mL | Freq: Once | INTRAVENOUS | Status: DC

## 2022-01-11 NOTE — ED Provider Notes (Incomplete)
[facility] EMERGENCY DEPARTMENT Provider Note   CSN: [id number] Arrival date & time: 01/11/22  2128     History {Add pertinent medical, surgical, social history, OB history to HPI:1} Chief Complaint  Patient presents with   Abdominal Pain    Lower abd pain since last tuesday, on abx for diverticulitis, denies NVD, pt states urine is dark, denies dysuria.    Jaime Owens is a 66 y.o. female.  Patient presents to the emergency department for evaluation of abdominal pain.  Patient has been having lower abdominal discomfort and abdominal bloating for a week.  She reports a history of diverticulitis, thought this might be recurrent diverticulitis.  She had a televisit yesterday and was prescribed Cipro and Flagyl, has had 3 doses.  Patient reports that symptoms have not changed since starting the antibiotics.  Tonight her urine was very dark and this concerned her as a possible side effect of the Cipro and Flagyl.  No vomiting, diarrhea.  No fever.      Home Medications Prior to Admission medications   Medication Sig Start Date End Date Taking? Authorizing Provider  acetaminophen (TYLENOL) 650 MG CR tablet Take 650-1,300 mg by mouth every 8 (eight) hours as needed for pain.    [provider]  Menthol-Camphor (TIGER BALM ARTHRITIS RUB EX) Apply 1 application topically daily as needed (for pain).    [provider]  Probiotic Product (PROBIOTIC PO) Take 1 capsule by mouth daily.    [provider]  triamcinolone (KENALOG) 0.1 % Apply 1 application topically 2 (two) times daily as needed.    [provider]      Allergies    Codeine, Flagyl [metronidazole hcl], and Penicillins    Review of Systems   Review of Systems  Gastrointestinal:  Positive for abdominal pain.   Physical Exam Updated Vital Signs BP (!) 178/85 (BP Location: Right Arm)    Pulse 74    Temp 97.8 F (36.6 C) (Oral)    Resp 17    Ht 5\' 5"  (1.651 m)    Wt 77.3 kg    SpO2 99%     BMI 28.36 kg/m  Physical Exam Vitals and nursing note reviewed.  Constitutional:      General: She is not in acute distress.    Appearance: She is well-developed.  HENT:     Head: Normocephalic and atraumatic.     Mouth/Throat:     Mouth: Mucous membranes are moist.  Eyes:     General: Vision grossly intact. Gaze aligned appropriately.     Extraocular Movements: Extraocular movements intact.     Conjunctiva/sclera: Conjunctivae normal.  Cardiovascular:     Rate and Rhythm: Normal rate and regular rhythm.     Pulses: Normal pulses.     Heart sounds: Normal heart sounds, S1 normal and S2 normal. No murmur heard.   No friction rub. No gallop.  Pulmonary:     Effort: Pulmonary effort is normal. No respiratory distress.     Breath sounds: Normal breath sounds.  Abdominal:     General: Bowel sounds are normal.     Palpations: Abdomen is soft.     Tenderness: There is generalized abdominal tenderness. There is no guarding or rebound.     Hernia: No hernia is present.  Musculoskeletal:        General: No swelling.     Cervical back: Full passive range of motion without pain, normal range of motion and neck supple. No spinous process tenderness or  muscular tenderness. Normal range of motion.     Right lower leg: No edema.     Left lower leg: No edema.  Skin:    General: Skin is warm and dry.     Capillary Refill: Capillary refill takes less than 2 seconds.     Findings: No ecchymosis, erythema, rash or wound.  Neurological:     General: No focal deficit present.     Mental Status: She is alert and oriented to person, place, and time.     GCS: GCS eye subscore is 4. GCS verbal subscore is 5. GCS motor subscore is 6.     Cranial Nerves: Cranial nerves 2-12 are intact.     Sensory: Sensation is intact.     Motor: Motor function is intact.     Coordination: Coordination is intact.  Psychiatric:        Attention and Perception: Attention normal.        Mood and Affect: Mood normal.         Speech: Speech normal.        Behavior: Behavior normal.    ED Results / Procedures / Treatments   Labs (all labs ordered are listed, but only abnormal results are displayed) Labs Reviewed  COMPREHENSIVE METABOLIC PANEL - Abnormal; Notable for the following components:      Result Value   Sodium 131 (*)    Chloride 96 (*)    Glucose, Bld 101 (*)    Total Bilirubin 0.2 (*)    All other components within normal limits  LIPASE, BLOOD  CBC  URINALYSIS, ROUTINE W REFLEX MICROSCOPIC    EKG None  Radiology No results found.  Procedures Procedures  {Document cardiac monitor, telemetry assessment procedure when appropriate:1}  Medications Ordered in ED Medications  sodium chloride 0.9 % bolus 500 mL (has no administration in time range)    ED Course/ Medical Decision Making/ A&P                           Medical Decision Making Amount and/or Complexity of Data Reviewed Labs: ordered. Radiology: ordered.   ***  {Document critical care time when appropriate:1} {Document review of labs and clinical decision tools ie heart score, Chads2Vasc2 etc:1}  {Document your independent review of radiology images, and any outside records:1} {Document your discussion with family members, caretakers, and with consultants:1} {Document social determinants of health affecting pt's care:1} {Document your decision making why or why not admission, treatments were needed:1} Final Clinical Impression(s) / ED Diagnoses Final diagnoses:  None    Rx / DC Orders ED Discharge Orders     None

## 2022-01-12 MED ORDER — KETOROLAC TROMETHAMINE 30 MG/ML IJ SOLN
15.0000 mg | Freq: Once | INTRAMUSCULAR | Status: DC
  Filled 2022-01-12: qty 1

## 2022-01-12 MED ORDER — IOHEXOL 300 MG/ML  SOLN
100.0000 mL | Freq: Once | INTRAMUSCULAR | Status: AC | PRN
  Administered 2022-01-12: 100 mL via INTRAVENOUS

## 2022-01-12 MED ORDER — HYDROCODONE-ACETAMINOPHEN 5-325 MG PO TABS
1.0000 | ORAL_TABLET | ORAL | 0 refills | Status: DC | PRN
Start: 2022-01-12 — End: 2022-05-12

## 2022-01-12 MED ORDER — IBUPROFEN 600 MG PO TABS: 600.0000 mg | ORAL_TABLET | Freq: Four times a day (QID) | ORAL | 0 refills | Status: DC | PRN

## 2022-01-12 MED ORDER — ONDANSETRON HCL 4 MG PO TABS: 4.0000 mg | ORAL_TABLET | Freq: Four times a day (QID) | ORAL | 0 refills | Status: DC

## 2022-03-22 ENCOUNTER — Ambulatory Visit: Payer: Self-pay | Admitting: Internal Medicine

## 2022-04-02 DIAGNOSIS — H524 Presbyopia: Secondary | ICD-10-CM | POA: Diagnosis not present

## 2022-04-02 DIAGNOSIS — H2513 Age-related nuclear cataract, bilateral: Secondary | ICD-10-CM | POA: Diagnosis not present

## 2022-05-12 ENCOUNTER — Ambulatory Visit (INDEPENDENT_AMBULATORY_CARE_PROVIDER_SITE_OTHER): Payer: PPO | Admitting: Internal Medicine

## 2022-05-12 ENCOUNTER — Encounter: Payer: Self-pay | Admitting: Internal Medicine

## 2022-05-12 VITALS — BP 120/80 | HR 76 | Ht 65.0 in | Wt 166.0 lb

## 2022-05-12 DIAGNOSIS — E042 Nontoxic multinodular goiter: Secondary | ICD-10-CM | POA: Diagnosis not present

## 2022-05-12 LAB — TSH: TSH: 1.15 u[IU]/mL (ref 0.35–5.50)

## 2022-05-12 LAB — T4, FREE: Free T4: 1.03 ng/dL (ref 0.60–1.60)

## 2022-05-12 NOTE — Progress Notes (Unsigned)
Name: Jaime Owens  MRN/ DOB: [id number], [birth year]    Age/ Sex: 66 y.o., female    PCP: Ladon Applebaum   Reason for Endocrinology Evaluation: MNG/Hypothyroidism     Date of Initial Endocrinology Evaluation: 12/10/2020    HPI: [name] is a 66 y.o. female with a past medical history of MNG. The patient presented for initial endocrinology clinic visit on 12/10/2020 for consultative assistance with her MNG/Hypothyroidism .   Pt has been diagnosed with MNG   At the age of 51. She had a benign FNA > 30 yrs ago. An ultrasound in 2016 revealed a left thyroid nodule at 2.3 cm and a right thyroid nodule at 2 cm.  She is S/P benign FNA of both right and left dominant thyroid nodules on 04/30/2015. She is S/P calcified left thyroid nodule 1.1  cm with benign cytology in 2018   She had a prescription for levothyroxine that she did not start in the past, her TSH was normal at 1.6 uIU/mL on her visit to our clinic and we opted not to start  No prior radiation exposure   Follows with cariology with [facility]  Mother with goiter    She was told in the past she had high calcium and to stop calcium, labs at our office indicated normal PTH at 24 PG/mL, normal calcium at 9.9 mg/DL (albumin 4.4W/NU)       SUBJECTIVE:    Today (05/12/22): [name] is here for follow-up on multinodular goiter .  Denies local neck enlargement Has a sensation of food stuck in her esophagus  Denies diarrhea or loose stools  No palpitations  Denies tremors  Vitamin D 5000 iu daily   HISTORY:  Past Medical History:  Past Medical History:  Diagnosis Date   Bladder infection    Past Surgical History:  Past Surgical History:  Procedure Laterality Date   NO PAST SURGERIES      Social History:  reports that she has quit smoking. Her smoking use included cigarettes. She has never used smokeless tobacco. She reports current alcohol use. She reports that she does not use drugs. Family  History: family history includes Cancer in an other family member; Colon cancer in her paternal aunt; Diabetes in her father; Diverticulosis in her mother; Heart disease in her maternal grandmother and mother; Hypertension in an other family member.   HOME MEDICATIONS: Allergies as of 05/12/2022       Reactions   Codeine Nausea Only   Flagyl [metronidazole Hcl] Other (See Comments)   Burning sensation   Penicillins Other (See Comments)   Unknown Has patient had a PCN reaction causing immediate rash, facial/tongue/throat swelling, SOB or lightheadedness with hypotension: Unknown Has patient had a PCN reaction causing severe rash involving mucus membranes or skin necrosis: Unknown Has patient had a PCN reaction that required hospitalization: Unknown Has patient had a PCN reaction occurring within the last 10 years: No If all of the above answers are "NO", then may proceed with Cephalosporin use.        Medication List        Accurate as of May 12, 2022 10:57 AM. If you have any questions, ask your nurse or doctor.          STOP taking these medications    HYDROcodone-acetaminophen 5-325 MG tablet Commonly known as: NORCO/VICODIN Stopped by: Scarlette Shorts, MD   ibuprofen 600 MG tablet Commonly known as: ADVIL Stopped by: Scarlette Shorts, MD  ondansetron 4 MG tablet Commonly known as: ZOFRAN Stopped by: Scarlette Shorts, MD   triamcinolone cream 0.1 % Commonly known as: KENALOG Stopped by: Scarlette Shorts, MD       TAKE these medications    acetaminophen 650 MG CR tablet Commonly known as: TYLENOL Take 650-1,300 mg by mouth every 8 (eight) hours as needed for pain.   PROBIOTIC PO Take 1 capsule by mouth daily.   TIGER BALM ARTHRITIS RUB EX Apply 1 application topically daily as needed (for pain).          REVIEW OF SYSTEMS: A comprehensive ROS was conducted with the patient and is negative except as per HPI     OBJECTIVE:   VS: BP 120/80 (BP Location: Left Arm, Patient Position: Sitting, Cuff Size: Small)   Pulse 76   Ht 5\' 5"  (1.651 m)   Wt 166 lb (75.3 kg)   SpO2 96%   BMI 27.62 kg/m    Wt Readings from Last 3 Encounters:  05/12/22 166 lb (75.3 kg)  01/11/22 170 lb 6.7 oz (77.3 kg)  12/10/20 155 lb 2 oz (70.4 kg)     EXAM: General: Pt appears well and is in NAD  Neck: General: Supple without adenopathy. Thyroid: Thyroid size normal. Right  and left nodules appreciated   Lungs: Clear with good BS bilat with no rales, rhonchi, or wheezes  Heart: Auscultation: RRR.  Abdomen: Normoactive bowel sounds, soft, nontender, without masses or organomegaly palpable  Extremities:  BL LE: No pretibial edema normal ROM and strength.  Mental Status: Judgment, insight: Intact Orientation: Oriented to time, place, and person Mood and affect: No depression, anxiety, or agitation     DATA REVIEWED:  ***    FNA left thyroid nodule 01/25/2017  Negative for malignant cells, Bet[place]day Category II ,consistent with benign follicular cells.   ASSESSMENT/PLAN/RECOMMENDATIONS:   Multinodular Goiter :  - Pt is clinically euthyroid  - She had an  ultrasound in 2016 revealed a left thyroid nodule at 2.3 cm and a right thyroid nodule at 2 cm.  - She is S/P benign FNA of both right and left dominant thyroid nodules on 04/30/2015. - She is S/P FNA of calcified left thyroid nodule 1.1  cm with benign cytology in 2018 - She was unable to proceed with thyroid ultrasound last year due to cost issues but has better insurance this year.       F/U in 1 yr     Signed electronically by: 2019, MD  [facility] Endocrinology  [facility] [address] [place], [place] Phone: [phone] FAX: [phone]   CC: [phone] [address] [place] Phone: [phone] Fax: [phone]   Return to Endocrinology clinic as  below: No future appointments.

## 2022-05-13 ENCOUNTER — Ambulatory Visit
Admission: RE | Admit: 2022-05-13 | Discharge: 2022-05-13 | Disposition: A | Discharge status: Home / Self Care | Payer: PPO | Source: Ambulatory Visit | Attending: Internal Medicine | Admitting: Internal Medicine

## 2022-05-13 DIAGNOSIS — E042 Nontoxic multinodular goiter: Secondary | ICD-10-CM

## 2022-05-13 DIAGNOSIS — E041 Nontoxic single thyroid nodule: Secondary | ICD-10-CM | POA: Diagnosis not present

## 2022-05-14 ENCOUNTER — Telehealth: Payer: Self-pay | Admitting: Internal Medicine

## 2022-05-14 NOTE — Telephone Encounter (Signed)
Discussed thyroid ultrasound results with the patient on 05/14/2022 at 1615    Thyroid ultrasound 05/13/2022 Estimated total number of nodules >/= 1 cm: 5   Number of spongiform nodules >/=  2 cm not described below (TR1): 0   Number of mixed cystic and solid nodules >/= 1.5 cm not described below (TR2): 0   _________________________________________________________   Nodule #1: Right mid thyroid nodule measures approximately 1.8 x 1.4 x 1.1 cm which is slightly smaller compared to 2.0 x 1.6 x 1.2 cm in 2016. Involution over 5 years is consistent with a benign process.   Nodule # 3: Nodule in the right lower gland measures 1.4 x 1.3 x 1.1 cm compared to 1.8 x 1.4 x 1.2 cm in 2016. Involution over 5 years is consistent with a benign process.   Nodule # 4: Elongated nodule in the right mid gland measures 1.4 x 1.2 x 0.7 cm, insignificantly changed compared to 1.3 x 1.1 x 0.7 cm in 2016.   Nodule # 6: Solid nodule in the inferior aspect of the left thyroid gland measures 2.4 x 2.2 x 1.5 cm, insignificantly changed compared to 2.3 x 2.3 x 1.5 cm in 2016.   No new nodules or suspicious features.   IMPRESSION: 1. There is been no significant interval change in the size, appearance or character of multiple bilateral thyroid nodules dating back to May of 2016. Greater than 5 year stability is consistent with benignity. 2. No new nodules or suspicious features.    We discussed confirm 5-year stability of thyroid nodules, and no serial ultrasound would be recommended by clinical monitoring    I have offered the patient to cancel future appointment if needed but she would like to keep those appointment for clinical follow-ups   Abby Raelyn Mora, MD  [facility] Endocrinology  [facility] [address] [place], [place] [address] Phone: [phone] FAX: [phone]

## 2022-07-13 IMAGING — CT CT ABD-PELV W/ CM
2 of 5 series · 16 of 46 positions shown, 18 images · IV contrast (Omnipaque or Isovue)
Comparison: CT 02/11/2011.  MRI 02/19/2011

CLINICAL DATA: Left lower quadrant abdominal pain. History of
diverticulitis.

EXAM:
CT ABDOMEN AND PELVIS WITH CONTRAST
TECHNIQUE: Multidetector CT imaging of the abdomen and pelvis was performed
using the standard protocol following bolus administration of
intravenous contrast.

[Series 2: axial st · axial · 0.69mm/px · z∈[+1150,+1506]mm · 13 of 83 slices shown, 15 images]
[im 6/83  soft-tissue]
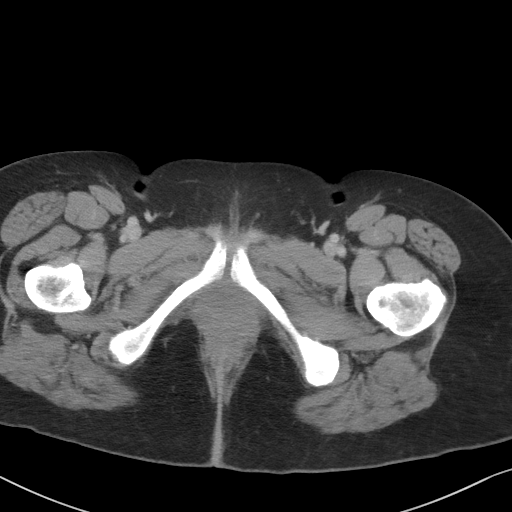
[im 6/83  bone]
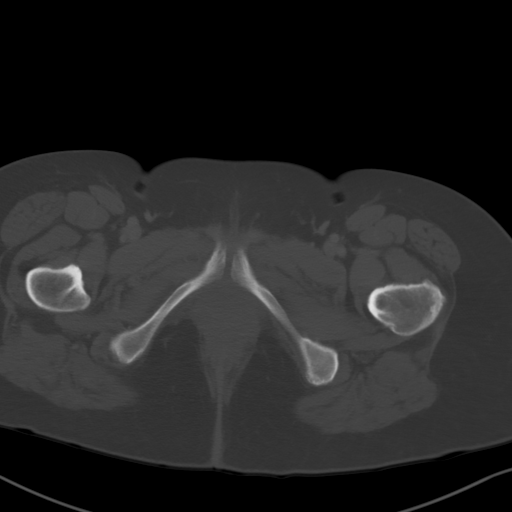
[im 11/83  soft-tissue]
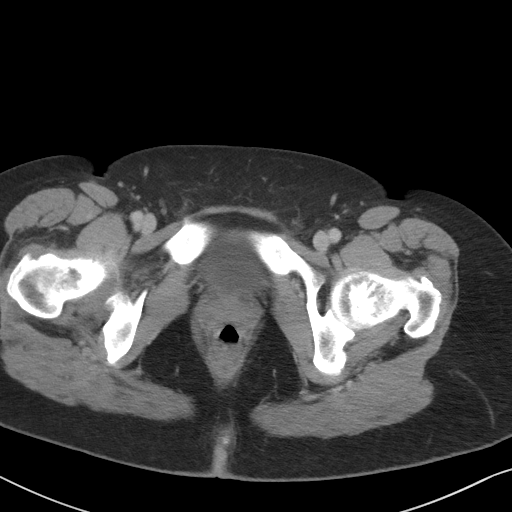
[im 17/83  soft-tissue]
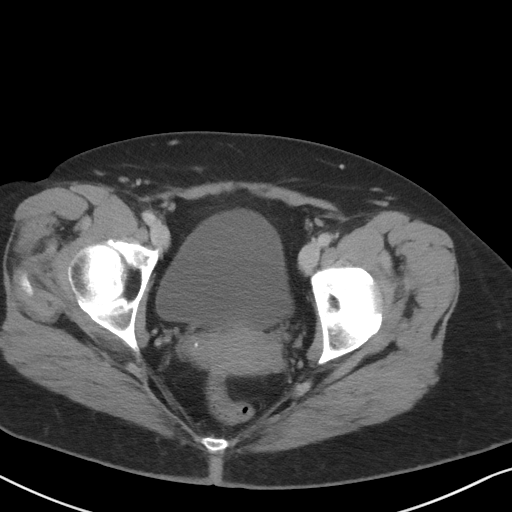
[im 22/83  soft-tissue]
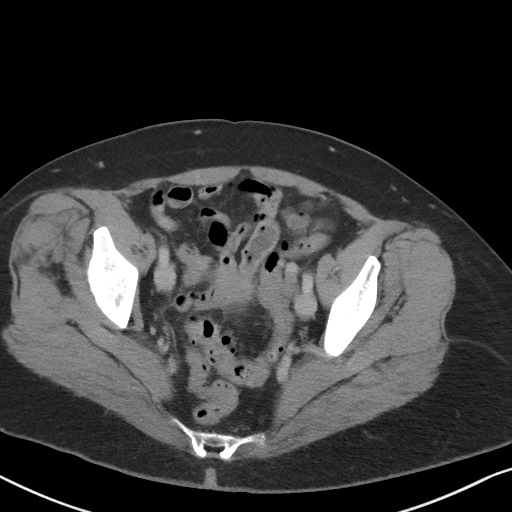
[im 28/83  soft-tissue]
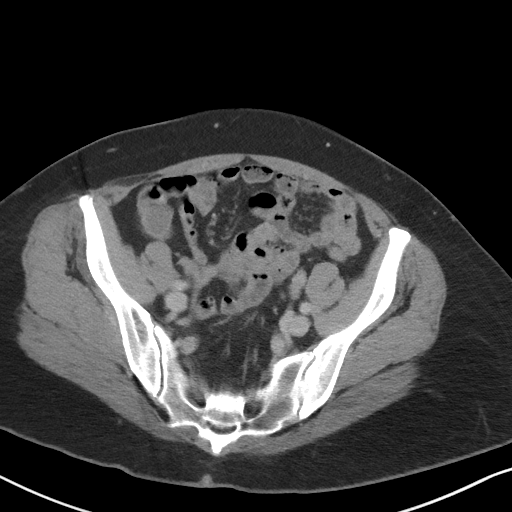
[im 33/83  soft-tissue]
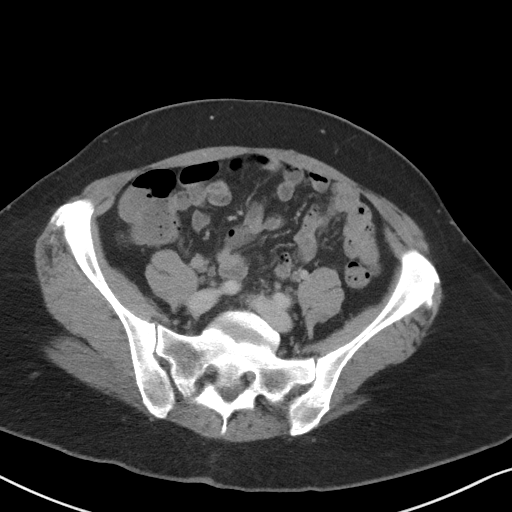
[im 44/83  soft-tissue]
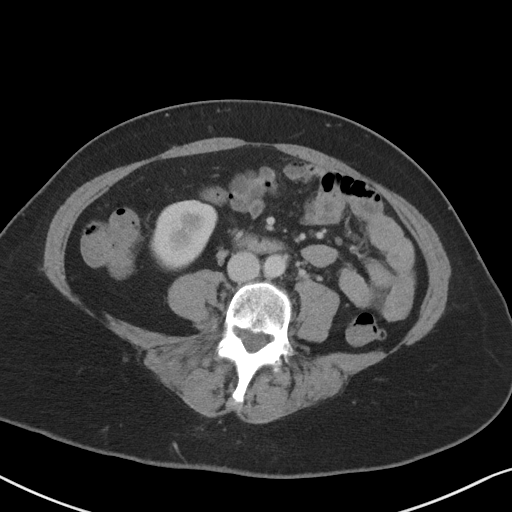
[im 50/83  soft-tissue]
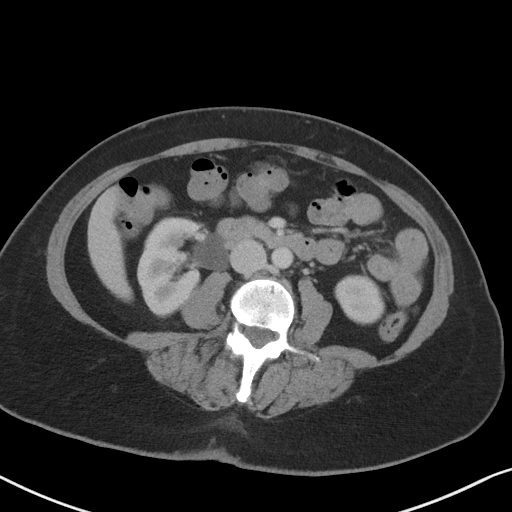
[im 55/83  soft-tissue]
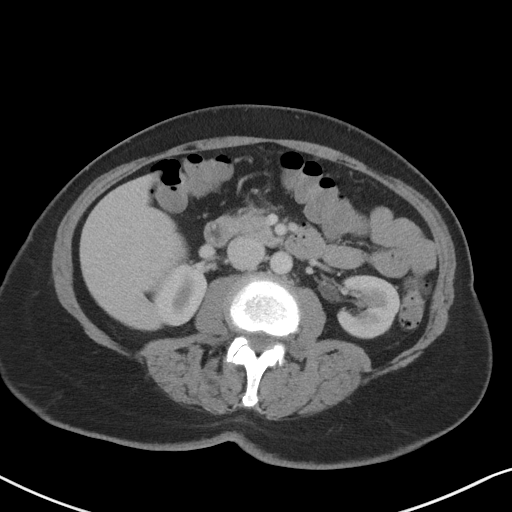
[im 55/83  bone]
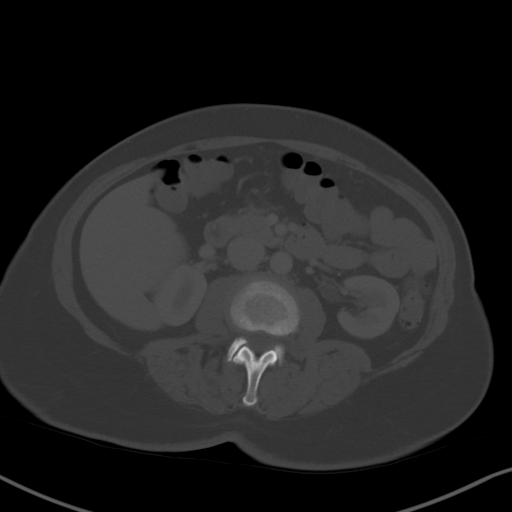
[im 61/83  soft-tissue]
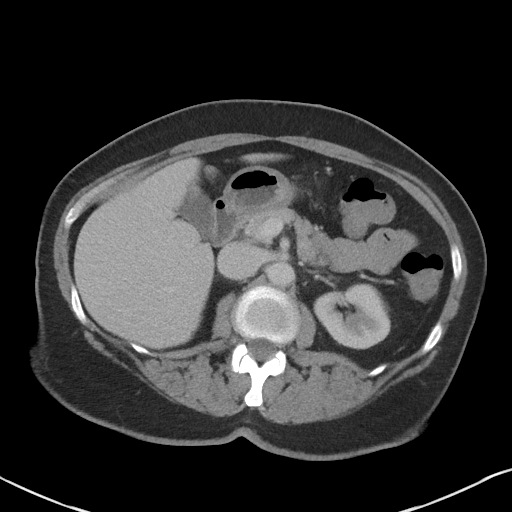
[im 66/83  soft-tissue]
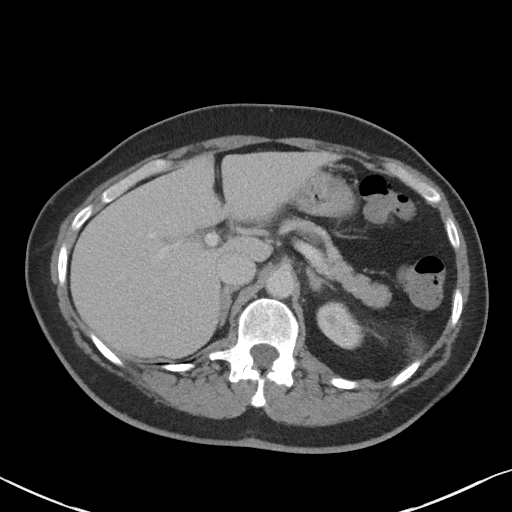
[im 72/83  soft-tissue]
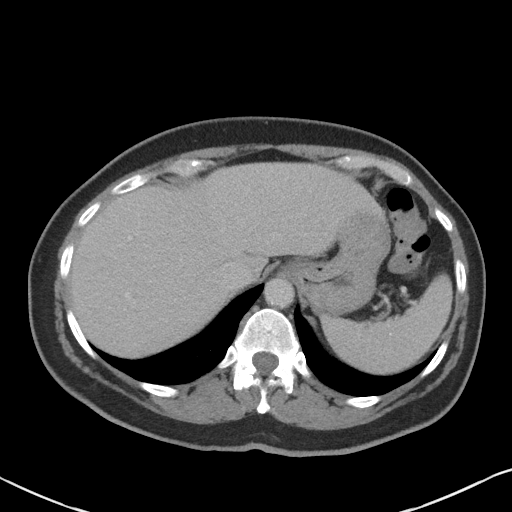
[im 77/83  soft-tissue]
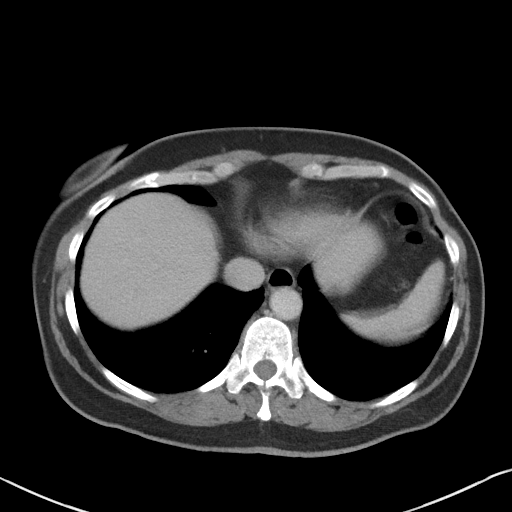

[Series 5: coronal st · coronal · 0.72mm/px · 3 of 81 slices shown]
[im 27/81  soft-tissue]
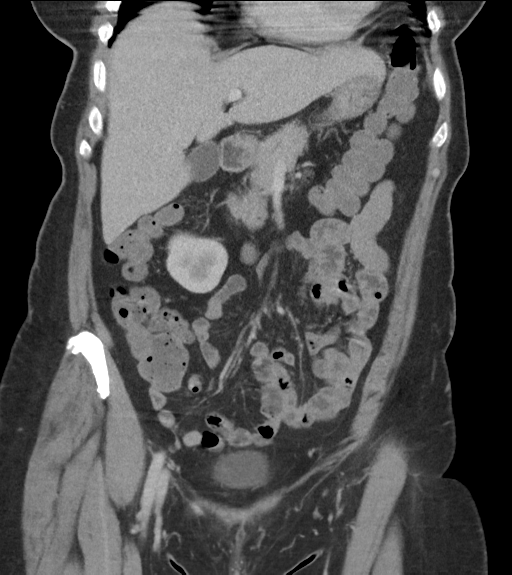
[im 36/81  soft-tissue]
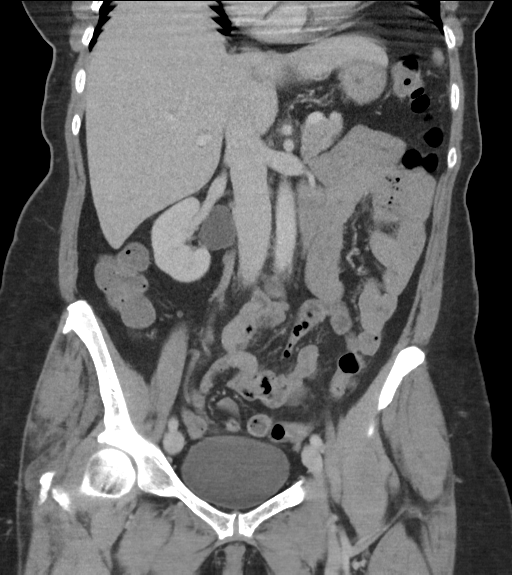
[im 45/81  soft-tissue]
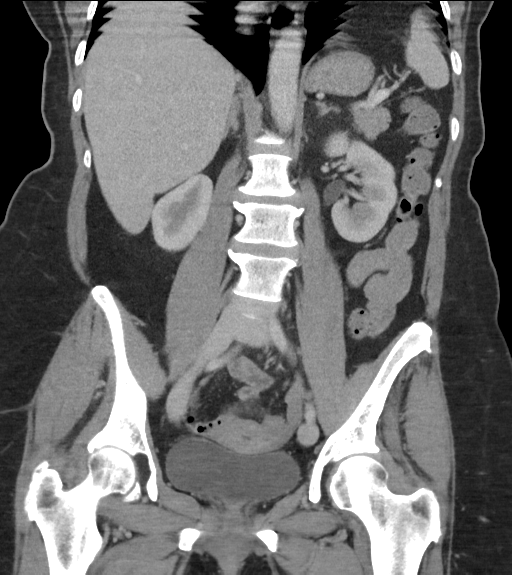

[16 of 46 positions shown; findings below may reference images not displayed]

RADIATION DOSE REDUCTION: This exam was performed according to the
departmental dose-optimization program which includes automated
exposure control, adjustment of the mA and/or kV according to
patient size and/or use of iterative reconstruction technique.

CONTRAST:  100mL OMNIPAQUE IOHEXOL 300 MG/ML  SOLN
FINDINGS: Lower chest: Lung bases are clear.

Hepatobiliary: Small focal lesions in the dome of the liver are
unchanged since prior study with previous imaging findings
consistent with cavernous hemangiomas. Gallbladder and bile ducts
are unremarkable.

Pancreas: Unremarkable. No pancreatic ductal dilatation or
surrounding inflammatory changes.

Spleen: Normal in size without focal abnormality.

Adrenals/Urinary Tract: Adrenal glands are unremarkable. Kidneys are
normal, without renal calculi, focal lesion, or hydronephrosis.
Bladder is unremarkable.

Stomach/Bowel: Stomach, small bowel, and colon are not abnormally
distended. Scattered diverticula in the sigmoid colon. No evidence
of acute diverticulitis. Focal area of infiltration around a fat
lobule adjacent to the sigmoid colon is most consistent with
epiploic appendagitis. Appendix is normal.

Vascular/Lymphatic: No significant vascular findings are present. No
enlarged abdominal or pelvic lymph nodes.

Reproductive: Uterus and bilateral adnexa are unremarkable.

Other: No abdominal wall hernia or abnormality. No abdominopelvic
ascites.

Musculoskeletal: No acute or significant osseous findings.
IMPRESSION: 1. No evidence of bowel obstruction or inflammation.
2. Focal area of stranding around a fat lobule adjacent to the
sigmoid colon consistent with epiploic appendagitis.
3. Unchanged liver lesions consistent with cavernous hemangiomas on
prior imaging.

## 2022-10-13 ENCOUNTER — Ambulatory Visit (INDEPENDENT_AMBULATORY_CARE_PROVIDER_SITE_OTHER): Payer: PPO

## 2022-10-13 ENCOUNTER — Encounter: Payer: Self-pay | Admitting: Family Medicine

## 2022-10-13 ENCOUNTER — Ambulatory Visit (INDEPENDENT_AMBULATORY_CARE_PROVIDER_SITE_OTHER): Payer: PPO | Admitting: Family Medicine

## 2022-10-13 VITALS — BP 142/92 | HR 86 | Temp 97.6°F | Ht 65.0 in | Wt 171.0 lb

## 2022-10-13 DIAGNOSIS — R058 Other specified cough: Secondary | ICD-10-CM

## 2022-10-13 DIAGNOSIS — L02213 Cutaneous abscess of chest wall: Secondary | ICD-10-CM | POA: Diagnosis not present

## 2022-10-13 DIAGNOSIS — R062 Wheezing: Secondary | ICD-10-CM | POA: Diagnosis not present

## 2022-10-13 DIAGNOSIS — R059 Cough, unspecified: Secondary | ICD-10-CM | POA: Diagnosis not present

## 2022-10-13 LAB — CBC WITH DIFFERENTIAL/PLATELET
Basophils Absolute: 0.1 10*3/uL (ref 0.0–0.1)
Basophils Relative: 0.7 % (ref 0.0–3.0)
Eosinophils Absolute: 0.8 10*3/uL — ABNORMAL HIGH (ref 0.0–0.7)
Eosinophils Relative: 9.5 % — ABNORMAL HIGH (ref 0.0–5.0)
HCT: 42.3 % (ref 36.0–46.0)
Hemoglobin: 14.4 g/dL (ref 12.0–15.0)
Lymphocytes Relative: 29.1 % (ref 12.0–46.0)
Lymphs Abs: 2.5 10*3/uL (ref 0.7–4.0)
MCHC: 33.9 g/dL (ref 30.0–36.0)
MCV: 87.6 fl (ref 78.0–100.0)
Monocytes Absolute: 0.8 10*3/uL (ref 0.1–1.0)
Monocytes Relative: 8.9 % (ref 3.0–12.0)
Neutro Abs: 4.4 10*3/uL (ref 1.4–7.7)
Neutrophils Relative %: 51.8 % (ref 43.0–77.0)
Platelets: 312 10*3/uL (ref 150.0–400.0)
RBC: 4.83 Mil/uL (ref 3.87–5.11)
RDW: 12.5 % (ref 11.5–15.5)
WBC: 8.5 10*3/uL (ref 4.0–10.5)

## 2022-10-13 MED ORDER — AZITHROMYCIN 250 MG PO TABS: ORAL_TABLET | ORAL | 0 refills | Status: DC

## 2022-10-13 MED ORDER — DOXYCYCLINE HYCLATE 100 MG PO TABS: 100.0000 mg | ORAL_TABLET | Freq: Two times a day (BID) | ORAL | 0 refills | Status: DC

## 2022-10-13 NOTE — Patient Instructions (Addendum)
You have an appointment with [facility] at 11:15 am. Please be there by 10:45 am to complete their New Patient paperwork.  Address: [address], [place], [place] [address] Phone: [phone]  Please go downstairs for labs and a chest X ray.  Start the antibiotic today.   Stay hydrated. You may take over the counter Mucinex as needed for congestion.   Follow up if you are not improving.

## 2022-10-13 NOTE — Progress Notes (Signed)
New Patient Office Visit  Subjective    Patient ID: Jaime Owens, female    DOB: Nov 08, 1955  Age: 66 y.o. MRN: [id number]  CC:  Chief Complaint  Patient presents with   Establish Care    Very painful cyst on chest wall for about 2 weeks    Allergies    Nasal congestion, post nasal drip and cough since end of October. Has only been taking benadryl     HPI Jaime Owens presents to establish care Previous PCP- 2 years ago.   C/o a 6 wk hx of nasal congestion, post nasal drip and mostly nonproductive cough.  Taking Benadryl.   She has dry eyes.   Also c/o painful cyst on on her chest wall x 2 wks.  States she had the same 5 years ago and it drained.   Denies fever, chills, dizziness, chest pain, palpitations, shortness of breath, abdominal pain, N/V/D, urinary symptoms, LE edema.      Outpatient Encounter Medications as of 10/13/2022  Medication Sig   acetaminophen (TYLENOL) 650 MG CR tablet Take 650-1,300 mg by mouth every 8 (eight) hours as needed for pain.   Boswellia Serrata (BOSWELLIA PO) Take 500 mg by mouth daily. Zazzee Boswellia Extract   1-2 capsules daily   CALCIUM PO Take by mouth. Vitamin Code Raw Calcium   2 capsules   Cholecalciferol (D3 PO) Take by mouth. Naturewise D3  1 soft gel   CRANBERRY PO Take by mouth daily. Zazzee Organic Cranberry   doxycycline (VIBRA-TABS) 100 MG tablet Take 1 tablet (100 mg total) by mouth 2 (two) times daily.   Menthol-Camphor (TIGER BALM ARTHRITIS RUB EX) Apply 1 application topically daily as needed (for pain).   Multiple Vitamins-Minerals (WOMENS MULTIVITAMIN PO) Take by mouth daily. Garden of Life Multivitamin + Probiotic   Multiple Vitamins-Minerals (ZINC PO) Take by mouth. Vitamin Code Raw Zinc  1 capsule   Omega-3 Fatty Acids (FISH OIL PO) Take by mouth daily. Barlean's Fish Oil   2 soft gels daily   TURMERIC CURCUMIN PO Take by mouth. Naturewise Turmeric Curcumin   2 capsules   UNABLE TO FIND Take by  mouth daily. Med Name: Zazzee Beet Root extract   UNABLE TO FIND Take by mouth. Med Name: Life Extension Macuguard Ocular Support   1 soft gel   UNABLE TO FIND Take by mouth daily. Med Name: Kappa Magnesium 3 in 1 L-Threonate   2 capsules   UNABLE TO FIND Take by mouth daily. Med Name: [employer] Glucosamine Chondroitin & MSM  2 capsules   [DISCONTINUED] azithromycin (ZITHROMAX) 250 MG tablet Take 2 tablets on day 1, then 1 tablet daily on days 2 through 5   [DISCONTINUED] Probiotic Product (PROBIOTIC PO) Take 1 capsule by mouth daily.   No facility-administered encounter medications on file as of 10/13/2022.    Past Medical History:  Diagnosis Date   Bladder infection     Past Surgical History:  Procedure Laterality Date   NO PAST SURGERIES      Family History  Problem Relation Age of Onset   Diabetes Father        deceased    Diverticulosis Mother    Heart disease Mother    Colon cancer Paternal Aunt    Cancer Other    Hypertension Other    Heart disease Maternal Grandmother     Social History   Socioeconomic History   Marital status: Widowed    Spouse name: Not on file  Number of children: 2   Years of education: Not on file   Highest education level: Not on file  Occupational History   Not on file  Tobacco Use   Smoking status: Former    Types: Cigarettes   Smokeless tobacco: Never  Substance and Sexual Activity   Alcohol use: Yes    Comment: occasionally   Drug use: No   Sexual activity: Yes    Birth control/protection: Post-menopausal  Other Topics Concern   Not on file  Social History Narrative   Not on file   Social Determinants of Health   Financial Resource Strain: Not on file  Food Insecurity: Not on file  Transportation Needs: Not on file  Physical Activity: Not on file  Stress: Not on file  Social Connections: Not on file  Intimate Partner Violence: Not on file    ROS      Objective    BP (!) 142/92 (BP  Location: Left Arm, Patient Position: Sitting, Cuff Size: Large)   Pulse 86   Temp 97.6 F (36.4 C) (Temporal)   Ht 5\' 5"  (1.651 m)   Wt 171 lb (77.6 kg)   SpO2 95%   BMI 28.46 kg/m   Physical Exam Constitutional:      General: She is not in acute distress.    Appearance: She is not ill-appearing.  HENT:     Right Ear: Tympanic membrane and ear canal normal.     Left Ear: Tympanic membrane and ear canal normal.     Mouth/Throat:     Mouth: Mucous membranes are moist.     Pharynx: Posterior oropharyngeal erythema present. No oropharyngeal exudate.  Cardiovascular:     Rate and Rhythm: Normal rate and regular rhythm.  Pulmonary:     Effort: Pulmonary effort is normal.     Breath sounds: Normal breath sounds.  Chest:     Chest wall: Mass and tenderness present.     Comments: Mid sternal large fluctuant mass, TTP Skin:    General: Skin is warm and dry.  Neurological:     General: No focal deficit present.     Mental Status: She is alert and oriented to person, place, and time.  Psychiatric:        Mood and Affect: Mood normal.        Behavior: Behavior normal.             Assessment & Plan:   Problem List Items Addressed This Visit   None Visit Diagnoses     Chest wall abscess    -  Primary   Relevant Orders   CBC with Differential/Platelet (Completed)   Cough present for greater than 3 weeks       Relevant Orders   CBC with Differential/Platelet (Completed)   DG Chest 2 View (Completed)      Called [facility] and they will see patient in their office in the morning for further evaluation of mass of chest wall. XR shows possible complex cyst vs solid mass.  She is non toxic.  Doxycycline prescribed for lower respiratory infection x 6 weeks. Also discussed symptomatic treatment. Follow up if not back to baseline after completing the antibiotic.   Return for needs fasting Welcome to Medicare in the next 3-4 months. , NP-C

## 2022-10-14 ENCOUNTER — Telehealth: Payer: Self-pay | Admitting: Family Medicine

## 2022-10-14 DIAGNOSIS — L723 Sebaceous cyst: Secondary | ICD-10-CM | POA: Diagnosis not present

## 2022-10-14 DIAGNOSIS — L02213 Cutaneous abscess of chest wall: Secondary | ICD-10-CM | POA: Diagnosis not present

## 2022-10-14 NOTE — Telephone Encounter (Signed)
PT calls today in regards to adverse reaction to their doxycycline (VIBRA-TABS) 100 MG tablet  RX. PT states she is experiencing pain in both of her arms that had only began after starting this RX. PT was notified to get up with [name] if this was to occur.   If we are to call back PT they will not be able to take a call from 10:45 till their procedure is done at [facility]. PT is ok with receiving a VM as well.   CB: [phone]

## 2022-10-14 NOTE — Telephone Encounter (Signed)
Called pt and relayed recommendations. Pt states she doesn't like nasal spray so probably will not do that but will try either the zyrtec or xyzal with mucinex. She will let us know if she is not getting better and f/u

## 2022-11-19 ENCOUNTER — Ambulatory Visit: Payer: PPO | Admitting: Family Medicine

## 2022-12-14 ENCOUNTER — Encounter: Payer: Self-pay | Admitting: Internal Medicine

## 2022-12-14 NOTE — Progress Notes (Unsigned)
    Subjective:    Patient ID: Jaime Owens, female    DOB: [birth year], 67 y.o.   MRN: [id number]      HPI Briahnna is here for No chief complaint on file.   She is here for an acute visit for cold symptoms.   Her symptoms started   She is experiencing   She has tried taking       Medications and allergies reviewed with patient and updated if appropriate.  Current Outpatient Medications on File Prior to Visit  Medication Sig Dispense Refill   acetaminophen (TYLENOL) 650 MG CR tablet Take 650-1,300 mg by mouth every 8 (eight) hours as needed for pain.     Boswellia Serrata (BOSWELLIA PO) Take 500 mg by mouth daily. Zazzee Boswellia Extract   1-2 capsules daily     CALCIUM PO Take by mouth. Vitamin Code Raw Calcium   2 capsules     Cholecalciferol (D3 PO) Take by mouth. Naturewise D3  1 soft gel     CRANBERRY PO Take by mouth daily. Zazzee Organic Cranberry     doxycycline (VIBRA-TABS) 100 MG tablet Take 1 tablet (100 mg total) by mouth 2 (two) times daily. 20 tablet 0   Menthol-Camphor (TIGER BALM ARTHRITIS RUB EX) Apply 1 application topically daily as needed (for pain).     Multiple Vitamins-Minerals (WOMENS MULTIVITAMIN PO) Take by mouth daily. Garden of Life Multivitamin + Probiotic     Multiple Vitamins-Minerals (ZINC PO) Take by mouth. Vitamin Code Raw Zinc  1 capsule     Omega-3 Fatty Acids (FISH OIL PO) Take by mouth daily. Barlean's Fish Oil   2 soft gels daily     TURMERIC CURCUMIN PO Take by mouth. Naturewise Turmeric Curcumin   2 capsules     UNABLE TO FIND Take by mouth daily. Med Name: Zazzee Beet Root extract     UNABLE TO FIND Take by mouth. Med Name: Life Extension Macuguard Ocular Support   1 soft gel     UNABLE TO FIND Take by mouth daily. Med Name: Kappa Magnesium 3 in 1 L-Threonate   2 capsules     UNABLE TO FIND Take by mouth daily. Med Name: [place] Glucosamine Chondroitin & MSM  2 capsules     No current  facility-administered medications on file prior to visit.    Review of Systems     Objective:  There were no vitals filed for this visit. BP Readings from Last 3 Encounters:  10/13/22 (!) 142/92  05/12/22 120/80  01/12/22 (!) 178/95   Wt Readings from Last 3 Encounters:  10/13/22 171 lb (77.6 kg)  05/12/22 166 lb (75.3 kg)  01/11/22 170 lb 6.7 oz (77.3 kg)   There is no height or weight on file to calculate BMI.    Physical Exam         Assessment & Plan:    See Problem List for Assessment and Plan of chronic medical problems.

## 2022-12-15 ENCOUNTER — Encounter: Payer: Self-pay | Admitting: Internal Medicine

## 2022-12-15 ENCOUNTER — Ambulatory Visit (INDEPENDENT_AMBULATORY_CARE_PROVIDER_SITE_OTHER): Payer: PPO | Admitting: Internal Medicine

## 2022-12-15 ENCOUNTER — Ambulatory Visit: Payer: PPO | Admitting: Family Medicine

## 2022-12-15 VITALS — BP 148/82 | HR 85 | Temp 98.7°F | Ht 65.0 in | Wt 172.0 lb

## 2022-12-15 DIAGNOSIS — L729 Follicular cyst of the skin and subcutaneous tissue, unspecified: Secondary | ICD-10-CM

## 2022-12-15 DIAGNOSIS — K224 Dyskinesia of esophagus: Secondary | ICD-10-CM | POA: Diagnosis not present

## 2022-12-15 DIAGNOSIS — R053 Chronic cough: Secondary | ICD-10-CM

## 2022-12-15 MED ORDER — AZITHROMYCIN 250 MG PO TABS: ORAL_TABLET | ORAL | 0 refills | Status: DC

## 2022-12-15 NOTE — Patient Instructions (Addendum)
      Medications changes include :   zpak - antibiotic    Consider trying taking a claritin daily and pepcid daily for one month to see if that helps the cough.     A referral was ordered for referral for dermatology -  dermatology specialists.   A referral was also ordered for [facility].    Someone will call you to schedule an appointment.    Return if symptoms worsen or fail to improve.

## 2022-12-16 ENCOUNTER — Encounter: Payer: Self-pay | Admitting: Gastroenterology

## 2022-12-31 ENCOUNTER — Telehealth: Payer: Self-pay

## 2022-12-31 DIAGNOSIS — R053 Chronic cough: Secondary | ICD-10-CM

## 2022-12-31 NOTE — Telephone Encounter (Signed)
Pt has called and stated she was seen by Dr. Quay Burow about her cough and it has gotten worse. She states she did take the Pepcid as directed in which it did not help.  Contact pt at [phone] with instuctions.

## 2022-12-31 NOTE — Telephone Encounter (Signed)
Since this is chronic -- Does she want a referral to ENT? Can also consider a stronger stomach medication to see if that helps such as prilosec or nexium.

## 2023-01-03 NOTE — Addendum Note (Signed)
Addended by: Binnie Rail on: 01/03/2023 02:56 PM   Modules accepted: Orders

## 2023-01-06 ENCOUNTER — Emergency Department (HOSPITAL_COMMUNITY): Payer: PPO

## 2023-01-06 ENCOUNTER — Other Ambulatory Visit: Payer: Self-pay

## 2023-01-06 ENCOUNTER — Emergency Department (HOSPITAL_COMMUNITY)
Admission: EM | Admit: 2023-01-06 | Discharge: 2023-01-06 | Disposition: A | Discharge status: Home / Self Care | Payer: PPO | Attending: Emergency Medicine | Admitting: Emergency Medicine

## 2023-01-06 ENCOUNTER — Ambulatory Visit: Payer: PPO | Admitting: Family Medicine

## 2023-01-06 DIAGNOSIS — Z20822 Contact with and (suspected) exposure to covid-19: Secondary | ICD-10-CM | POA: Insufficient documentation

## 2023-01-06 DIAGNOSIS — R0600 Dyspnea, unspecified: Secondary | ICD-10-CM

## 2023-01-06 DIAGNOSIS — R0602 Shortness of breath: Secondary | ICD-10-CM | POA: Diagnosis not present

## 2023-01-06 DIAGNOSIS — E871 Hypo-osmolality and hyponatremia: Secondary | ICD-10-CM | POA: Insufficient documentation

## 2023-01-06 DIAGNOSIS — R0981 Nasal congestion: Secondary | ICD-10-CM | POA: Insufficient documentation

## 2023-01-06 DIAGNOSIS — R053 Chronic cough: Secondary | ICD-10-CM | POA: Diagnosis not present

## 2023-01-06 DIAGNOSIS — R059 Cough, unspecified: Secondary | ICD-10-CM | POA: Diagnosis not present

## 2023-01-06 LAB — CBC WITH DIFFERENTIAL/PLATELET
Abs Immature Granulocytes: 0.01 10*3/uL (ref 0.00–0.07)
Basophils Absolute: 0.1 10*3/uL (ref 0.0–0.1)
Basophils Relative: 1 %
Eosinophils Absolute: 0.4 10*3/uL (ref 0.0–0.5)
Eosinophils Relative: 5 %
HCT: 41.8 % (ref 36.0–46.0)
Hemoglobin: 13.9 g/dL (ref 12.0–15.0)
Immature Granulocytes: 0 %
Lymphocytes Relative: 34 %
Lymphs Abs: 2.3 10*3/uL (ref 0.7–4.0)
MCH: 29.5 pg (ref 26.0–34.0)
MCHC: 33.3 g/dL (ref 30.0–36.0)
MCV: 88.7 fL (ref 80.0–100.0)
Monocytes Absolute: 0.4 10*3/uL (ref 0.1–1.0)
Monocytes Relative: 6 %
Neutro Abs: 3.7 10*3/uL (ref 1.7–7.7)
Neutrophils Relative %: 54 %
Platelets: 241 10*3/uL (ref 150–400)
RBC: 4.71 MIL/uL (ref 3.87–5.11)
RDW: 11.9 % (ref 11.5–15.5)
WBC: 6.9 10*3/uL (ref 4.0–10.5)
nRBC: 0 % (ref 0.0–0.2)

## 2023-01-06 LAB — D-DIMER, QUANTITATIVE: D-Dimer, Quant: <0.27 ug/mL-FEU (ref 0.00–0.50)

## 2023-01-06 LAB — BASIC METABOLIC PANEL
Anion gap: 10 (ref 5–15)
BUN: 10 mg/dL (ref 8–23)
CO2: 22 mmol/L (ref 22–32)
Calcium: 9.2 mg/dL (ref 8.9–10.3)
Chloride: 99 mmol/L (ref 98–111)
Creatinine, Ser: 0.68 mg/dL (ref 0.44–1.00)
GFR, Estimated: >60 mL/min (ref >60)
Glucose, Bld: 102 mg/dL — ABNORMAL HIGH (ref 70–99)
Potassium: 3.9 mmol/L (ref 3.5–5.1)
Sodium: 131 mmol/L — ABNORMAL LOW (ref 135–145)

## 2023-01-06 LAB — RESP PANEL BY RT-PCR (RSV, FLU A&B, COVID)  RVPGX2
Influenza A by PCR: NEGATIVE
Influenza B by PCR: NEGATIVE
Resp Syncytial Virus by PCR: NEGATIVE
SARS Coronavirus 2 by RT PCR: NEGATIVE

## 2023-01-06 LAB — TROPONIN I (HIGH SENSITIVITY)
Troponin I (High Sensitivity): 3 ng/L (ref <18)
Troponin I (High Sensitivity): <2 ng/L (ref <18)

## 2023-01-06 MED ORDER — BENZONATATE 100 MG PO CAPS: 100.0000 mg | ORAL_CAPSULE | Freq: Three times a day (TID) | ORAL | 0 refills | Status: DC

## 2023-01-06 MED ORDER — ALBUTEROL SULFATE HFA 108 (90 BASE) MCG/ACT IN AERS: 1.0000 | INHALATION_SPRAY | Freq: Four times a day (QID) | RESPIRATORY_TRACT | 0 refills | Status: DC | PRN

## 2023-01-06 NOTE — ED Notes (Signed)
Patient transported to X-ray 

## 2023-01-06 NOTE — ED Notes (Signed)
ED Provider at bedside. 

## 2023-01-06 NOTE — ED Provider Notes (Signed)
[facility] Provider Note   CSN: [id number] Arrival date & time: 01/06/23  1333     History Chief Complaint  Patient presents with   Nasal Congestion   Shortness of Breath   Cough    Jaime Owens is a 67 y.o. female patient who presents to the emergency department with chronic cough since October and new shortness of breath that has been worsening over the last couple of weeks.  Patient states that shortness of breath is primary with exertion and she reports associated chest tightness with this.  Patient does also endorse productive cough.  Patient denies fever, chills, sore throat, abdominal pain, nausea, vomiting, diarrhea.  No history of cancer, recent travel, recent surgery.   Shortness of Breath Associated symptoms: cough   Cough Associated symptoms: shortness of breath        Home Medications Prior to Admission medications   Medication Sig Start Date End Date Taking? Authorizing Provider  albuterol (VENTOLIN HFA) 108 (90 Base) MCG/ACT inhaler Inhale 1-2 puffs into the lungs every 6 (six) hours as needed for wheezing or shortness of breath. 01/06/23  Yes Raul Del, Shemika Robbs M, PA-C  benzonatate (TESSALON) 100 MG capsule Take 1 capsule (100 mg total) by mouth every 8 (eight) hours. 01/06/23  Yes Raul Del, Raini Tiley M, PA-C  acetaminophen (TYLENOL) 650 MG CR tablet Take 650-1,300 mg by mouth every 8 (eight) hours as needed for pain.    [provider]  azithromycin (ZITHROMAX) 250 MG tablet Take two tabs the first day and then one tab daily for four days 12/15/22   Binnie Rail, MD  Boswellia Serrata (BOSWELLIA PO) Take 500 mg by mouth daily. Zazzee Boswellia Extract   1-2 capsules daily    [provider]  CALCIUM PO Take by mouth. Vitamin Code Raw Calcium   2 capsules    [provider]  Cholecalciferol (D3 PO) Take by mouth. Naturewise D3  1 soft gel    [provider]  CRANBERRY PO Take by mouth  daily. Zazzee Organic Cranberry    [provider]  Menthol-Camphor (TIGER BALM ARTHRITIS RUB EX) Apply 1 application topically daily as needed (for pain).    [provider]  Multiple Vitamins-Minerals (WOMENS MULTIVITAMIN PO) Take by mouth daily. Garden of Life Multivitamin + Probiotic    [provider]  Multiple Vitamins-Minerals (ZINC PO) Take by mouth. Vitamin Code Raw Zinc  1 capsule    [provider]  Omega-3 Fatty Acids (FISH OIL PO) Take by mouth daily. Barlean's Fish Oil   2 soft gels daily    [provider]  TURMERIC CURCUMIN PO Take by mouth. Naturewise Turmeric Curcumin   2 capsules    [provider]  UNABLE TO FIND Take by mouth daily. Med Name: Zazzee Beet Root extract    [provider]  UNABLE TO FIND Take by mouth. Med Name: Life Extension Macuguard Ocular Support   1 soft gel    [provider]  UNABLE TO FIND Take by mouth daily. Med Name: Kappa Magnesium 3 in 1 L-Threonate   2 capsules    [provider]  UNABLE TO FIND Take by mouth daily. Med Name: [employer] Glucosamine Chondroitin & MSM  2 capsules    [provider]      Allergies    Codeine, Doxycycline hyclate, Flagyl [metronidazole hcl], Penicillins, and Sulfamethoxazole-trimethoprim    Review of Systems   Review of Systems  Respiratory:  Positive for cough and shortness of breath.   All other systems reviewed and are negative.   Physical Exam Updated Vital Signs BP (!) 180/97 (BP Location: Left Arm)   Pulse 81   Temp 98.2 F (36.8 C) (Oral)   Resp 16   Ht '5\' 5"'$  (1.651 m)   Wt 78 kg   SpO2 96%   BMI 28.62 kg/m  Physical Exam Vitals and nursing note reviewed.  Constitutional:      General: She is not in acute distress.    Appearance: Normal appearance.  HENT:     Head: Normocephalic and atraumatic.  Eyes:     General:        Right eye: No discharge.        Left eye: No discharge.   Cardiovascular:     Comments: Regular rate and rhythm.  S1/S2 are distinct without any evidence of murmur, rubs, or gallops.  Radial pulses are 2+ bilaterally.  Dorsalis pedis pulses are 2+ bilaterally.  No evidence of pedal edema. Pulmonary:     Comments: Clear to auscultation bilaterally.  Normal effort.  No respiratory distress.  No evidence of wheezes, rales, or rhonchi heard throughout. Abdominal:     General: Abdomen is flat. Bowel sounds are normal. There is no distension.     Tenderness: There is no abdominal tenderness. There is no guarding or rebound.  Musculoskeletal:        General: Normal range of motion.     Cervical back: Neck supple.  Skin:    General: Skin is warm and dry.     Findings: No rash.  Neurological:     General: No focal deficit present.     Mental Status: She is alert.  Psychiatric:        Mood and Affect: Mood normal.        Behavior: Behavior normal.     ED Results / Procedures / Treatments   Labs (all labs ordered are listed, but only abnormal results are displayed) Labs Reviewed  BASIC METABOLIC PANEL - Abnormal; Notable for the following components:      Result Value   Sodium 131 (*)    Glucose, Bld 102 (*)    All other components within normal limits  RESP PANEL BY RT-PCR (RSV, FLU A&B, COVID)  RVPGX2  CBC WITH DIFFERENTIAL/PLATELET  D-DIMER, QUANTITATIVE  TROPONIN I (HIGH SENSITIVITY)  TROPONIN I (HIGH SENSITIVITY)    EKG None  Radiology DG Chest 2 View  Result Date: 01/06/2023 CLINICAL DATA:  Shortness of breath.  Congestion and cough. EXAM: CHEST - 2 VIEW COMPARISON:  Chest radiograph 10/13/2022 FINDINGS: Of note, the superior field of view on the PA view does not fully include the first ribs. The heart size and mediastinal contours are within normal limits. Both lungs are clear. No pleural effusion or pneumothorax. Multilevel degenerative changes in the midthoracic spine. The previously described round convex mass on the anterior  chest wall is markedly decreased in size compared to 10/13/2022, measuring 2 cm craniocaudal dimension compared to 6 cm previously. IMPRESSION: 1. No acute cardiopulmonary process. 2. 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 Electronically Signed   By: Ileana Roup M.D.   On: 01/06/2023 14:46    Procedures Procedures    Medications Ordered in ED Medications - No data to display  ED Course/ Medical Decision Making/ A&P Clinical Course as of 01/06/23  [place]  Thu Jan 06, 2023  1530 CBC with Differential Normal. [CF]  1530 D-dimer, quantitative Normal. [CF]  0998 Basic metabolic panel(!) Mild hyponatremia but otherwise no significant abnormalities. [CF]  1531 Resp panel by RT-PCR (RSV, Flu A&B, Covid) Anterior Nasal Swab Negative. [CF]  1531 Troponin I (High Sensitivity) Initial troponin is normal. [CF]  1652 Troponin I (High Sensitivity) Delta troponin is negative.  [CF]  3382 On reevaluation, patient is about the same.  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  I discussed some treatment options with her including Tessalon Perles and albuterol inhaler.  I will have her follow-up with her primary care doctor.  Strict turn precautions were discussed. [CF]    Clinical Course User Index [CF] Hendricks Limes, PA-C   {                              Medical Decision Making Jaime Owens is a 67 y.o. female patient who presents to the emergency department today for further evaluation of shortness of breath, somewhat productive cough chronic, and chest tightness.  Patient's lungs are clear and I have a low suspicion for any infectious process such as pneumonia.  Patient has been having some sinus issues and her cough could be related to postnasal drip.  Patient does have a remote history of smoking but seen that she is  having exertional dyspnea with some chest tightness I will add on troponins and a D-dimer to further evaluate for pulmonary embolism and cardiac ischemia.  Patient's vital signs are normal apart from hypertension here.  She is in no acute distress at this time.   Cardiac enzymes are normal.  Rest of her labs are normal as well.  She will follow-up with her primary care doctor.  I am going to prescribe her Tessalon Perles and an albuterol inhaler for the wheezing.  Educated her on that she can continue taking Mucinex and antihistamine as needed.  Strict return precautions were discussed.  She is safe for discharge at this time.  Amount and/or Complexity of Data Reviewed Labs: ordered. Decision-making details documented in ED Course. Radiology: ordered.  Risk Prescription drug management.    Final Clinical Impression(s) / ED Diagnoses Final diagnoses:  Dyspnea, unspecified type  Chronic cough    Rx / DC Orders ED Discharge Orders          Ordered    benzonatate (TESSALON) 100 MG capsule  Every 8 hours        01/06/23 1704    albuterol (VENTOLIN HFA) 108 (90 Base) MCG/ACT inhaler  Every 6 hours PRN        01/06/23 1704              Hendricks Limes, PA-C 01/06/23 1707    Fransico Meadow, MD 01/07/23 [phone]

## 2023-01-06 NOTE — ED Triage Notes (Signed)
Pt states she has been having SOB along with cold like s/s, congestion, cough.  States she took a zpak that was prescribed on 12/15/22 that helped some but now it has gotten worse. States the triage nurse at her PCP told her to come to ER so she could possibly have CT done.

## 2023-01-06 NOTE — Discharge Instructions (Signed)
I have prescribed you 2 prescriptions today.  The first 1 is Best boy which should help with the cough.  The second is an albuterol inhaler which you can take as needed for wheezing and some shortness of breath.  I would like you to follow-up with your primary care doctor after you finish Tessalon Perles if you are not improved.  You may return to the emergency room at anytime for any worsening symptoms.

## 2023-01-12 ENCOUNTER — Ambulatory Visit: Payer: PPO | Admitting: Internal Medicine

## 2023-02-25 DIAGNOSIS — K219 Gastro-esophageal reflux disease without esophagitis: Secondary | ICD-10-CM | POA: Diagnosis not present

## 2023-02-25 DIAGNOSIS — E041 Nontoxic single thyroid nodule: Secondary | ICD-10-CM | POA: Diagnosis not present

## 2023-02-25 DIAGNOSIS — R053 Chronic cough: Secondary | ICD-10-CM | POA: Diagnosis not present

## 2023-02-25 DIAGNOSIS — K224 Dyskinesia of esophagus: Secondary | ICD-10-CM | POA: Diagnosis not present

## 2023-02-28 DIAGNOSIS — J3489 Other specified disorders of nose and nasal sinuses: Secondary | ICD-10-CM | POA: Diagnosis not present

## 2023-02-28 DIAGNOSIS — R053 Chronic cough: Secondary | ICD-10-CM | POA: Diagnosis not present

## 2023-03-01 NOTE — Progress Notes (Signed)
Jaime Owens    [id number]    09/17/1956  Primary Care Physician:Henson, Zorita Pang, NP-C  Referring Physician: Pincus Sanes, MD [address] [place],  [place] [address]   Chief complaint:  Chief Complaint  Patient presents with   Esophageal spasms   HPI: Jaime Owens is a 67 y.o. female presenting to clinic today for consult for esophageal spasms at the request of Dr. Cheryll Owens. She was seen in the ED on 01-06-23. Per that note, she was experiencing chest pain and SOB.     Today, she complains of a feeling of constriction in her upper  esophageal track when swallowing large pills or certain foods. She has to eat a bite of something to feel that it has been completely swallowed. Her symptoms are worsened when eating/ drinking cold foods as she can feel it more and states that her symptoms has been persisted for the past 7 years.  She also experiences occasional heartburn and would take Pepcid as needed. She would rarely experience nausea and vomiting.   She reports taking Curcumin twice daily and states she occasionally takes NSAID's.   She reports that her niece has crohn's and her mother had a history of esophageal spasms and PVC. She denies having done an EGD before or having a heart monitor.    GI Hx:  ECHO 10-21-20 1. Left ventricular ejection fraction, by estimation, is 60 to 65%. Left ventricular ejection fraction by 3D volume is 62 %. The left ventricle has normal function. The left ventricle has no regional wall motion abnormalities. Left ventricular diastolic parameters were normal. The average left ventricular global longitudinal strain is -25.1 %. The global longitudinal strain is normal.  2. Right ventricular systolic function is normal. The right ventricular size is normal. There is normal pulmonary artery systolic pressure. The estimated right ventricular systolic pressure is 31.7 mmHg.  3. The mitral valve is grossly normal. Trivial mitral valve  regurgitation. No evidence of mitral stenosis.  4. The aortic valve is tricuspid. Aortic valve regurgitation is not visualized. No aortic stenosis is present.  5. The inferior vena cava is normal in size with greater than 50% respiratory variability, suggesting right atrial pressure of 3 mmHg.   CT Abdomen and Pelvis w contrast 01-11-22 1. No evidence of bowel obstruction or inflammation. 2. Focal area of stranding around a fat lobule adjacent to the sigmoid colon consistent with epiploic appendagitis. 3. 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  Colonoscopy on 03-03-11  - moderate hemorrhoids were found.    MRI ABDOMEN WITH AND WITHOUT CONTRAST 02-19-11 1.  There are no suspicious malignant features associated with the  hypodense liver abnormalities identified on CT from 02/11/2011.  The largest lesion is in the central right hepatic lobe and  measures 10 mm.  Favor atypical presentation of benign liver  hemangioma.  Because the typical early, peripheral nodular  enhancement of a benign liver hemangioma is not identified on this  structure I would suggest the patient undergo a follow-up  examination in 6-12 months to ensure stability of this structure.  2.  The other small liver lesions most likely represent benign  hemangiomas.    Current Outpatient Medications:    acetaminophen (TYLENOL) 650 MG CR tablet, Take 650-1,300 mg by mouth every 8 (eight) hours as needed for pain., Disp: , Rfl:    albuterol (VENTOLIN HFA) 108 (90 Base) MCG/ACT inhaler, Inhale 1-2 puffs into  the lungs every 6 (six) hours as needed for wheezing or shortness of breath., Disp: 18 g, Rfl: 0   Boswellia Serrata (BOSWELLIA PO), Take 500 mg by mouth daily. Zazzee Boswellia Extract   1-2 capsules daily, Disp: , Rfl:    CALCIUM PO, Take by mouth. Vitamin Code Raw Calcium   2 capsules, Disp: , Rfl:    Cholecalciferol (D3 PO), Take by mouth. Naturewise D3  1 soft gel, Disp: , Rfl:     CRANBERRY PO, Take by mouth daily. Zazzee Organic Cranberry, Disp: , Rfl:    famotidine-calcium carbonate-magnesium hydroxide (PEPCID COMPLETE) 10-800-165 MG chewable tablet, , Disp: , Rfl:    Menthol-Camphor (TIGER BALM ARTHRITIS RUB EX), Apply 1 application topically daily as needed (for pain)., Disp: , Rfl:    Multiple Vitamins-Minerals (WOMENS MULTIVITAMIN PO), Take by mouth daily. Garden of Life Multivitamin + Probiotic, Disp: , Rfl:    Multiple Vitamins-Minerals (ZINC PO), Take by mouth. Vitamin Code Raw Zinc  1 capsule, Disp: , Rfl:    Omega-3 Fatty Acids (FISH OIL PO), Take by mouth daily. Barlean's Fish Oil   2 soft gels daily, Disp: , Rfl:    TURMERIC CURCUMIN PO, Take by mouth. Naturewise Turmeric Curcumin   2 capsules, Disp: , Rfl:    UNABLE TO FIND, Take by mouth daily. Med Name: Zazzee Beet Root extract, Disp: , Rfl:    UNABLE TO FIND, Take by mouth. Med Name: Life Extension Macuguard Ocular Support   1 soft gel, Disp: , Rfl:    UNABLE TO FIND, Take by mouth daily. Med Name: Kappa Magnesium 3 in 1 L-Threonate   2 capsules, Disp: , Rfl:    UNABLE TO FIND, Take by mouth daily. Med Name: 365 [employer] Glucosamine Chondroitin & MSM  2 capsules, Disp: , Rfl:    Allergies as of 03/02/2023 - Review Complete 03/02/2023  Allergen Reaction Noted   Codeine Nausea Only 02/10/2011   Doxycycline hyclate  10/13/2022   Flagyl [metronidazole hcl] Other (See Comments) 02/10/2011   Penicillins Other (See Comments) 02/10/2011   Sulfamethoxazole-trimethoprim Nausea Only 12/15/2022    Past Medical History:  Diagnosis Date   Acid reflux    Bladder infection    Chronic cough    Thyroid goiter     Past Surgical History:  Procedure Laterality Date   NO PAST SURGERIES      Family History  Problem Relation Age of Onset   Diverticulosis Mother    Heart disease Mother    Other Mother        esophageal spasms   Diabetes Father        deceased    Heart disease Maternal Grandmother     Colon cancer Paternal Aunt    Cancer Other    Hypertension Other    Stomach cancer Neg Hx    Esophageal cancer Neg Hx     Social History   Socioeconomic History   Marital status: Widowed    Spouse name: Not on file   Number of children: 2   Years of education: Not on file   Highest education level: Not on file  Occupational History   Occupation: Retired  Tobacco Use   Smoking status: Former    Types: Cigarettes   Smokeless tobacco: Never  Building services engineer Use: Never used  Substance and Sexual Activity   Alcohol use: Yes    Comment: occasionally   Drug use: No   Sexual activity: Yes    Birth  control/protection: Post-menopausal  Other Topics Concern   Not on file  Social History Narrative   Not on file   Social Determinants of Health   Financial Resource Strain: Not on file  Food Insecurity: Not on file  Transportation Needs: Not on file  Physical Activity: Not on file  Stress: Not on file  Social Connections: Not on file  Intimate Partner Violence: Not on file      Review of systems: Review of Systems  Constitutional:  Negative for unexpected weight change.  HENT:  Positive for trouble swallowing.   Gastrointestinal:  Positive for nausea and vomiting. Negative for abdominal distention, abdominal pain, anal bleeding, blood in stool, constipation, diarrhea and rectal pain.      Physical Exam: Vitals:   03/02/23 1350  BP: 124/70  Pulse: 91  SpO2: 95%   Body mass index is 29.7 kg/m.  General: well-appearing   Eyes: sclera anicteric, no redness ENT: oral mucosa moist without lesions, no cervical or supraclavicular lymphadenopathy CV: no JVD, no peripheral edema, PVC  Resp: clear to auscultation bilaterally, normal RR and effort noted GI: soft, no tenderness, with active bowel sounds. No guarding or palpable organomegaly noted. Skin; warm and dry, no rash or jaundice noted Neuro: awake, alert and oriented x 3. Normal gross motor function and  fluent speech   Data Reviewed:  Reviewed labs, radiology imaging, old records and pertinent past GI work up   Assessment and Plan/Recommendations: 67 year old very pleasant female here for new patient visit with complaints of intermittent esophageal spasms and dysphagia Schedule for EGD to exclude erosive esophagitis or eosinophilic esophagitis, esophageal ring/web or neoplastic lesion The risks and benefits as well as alternatives of endoscopic procedure(s) have been discussed and reviewed. All questions answered. The patient agrees to proceed.   Avoid NSAIDs, decrease curcumin dose Advised patient to eat small meals with low-fat diet and avoid eating anything 3 to 4 hours before bedtime Use peppermint or FD guard as needed for spasms or dyspepsia symptoms  She has history of palpitations, advised patient to follow-up with cardiology to exclude dysrhythmia.  This visit required 60 minutes of patient care (this includes precharting, chart review, review of results, face-to-face time used for counseling as well as treatment plan and follow-up. The patient was provided an opportunity to ask questions and all were answered. The patient agreed with the plan and demonstrated an understanding of the instructions.  Iona Beard , MD  CC: Pincus Sanes, MD   Ladona Mow Hewitt Shorts as a scribe for Marsa Aris, MD.,have documented all relevant documentation on the behalf of Marsa Aris, MD,as directed by  Marsa Aris, MD while in the presence of Marsa Aris, MD.   I, Marsa Aris, MD, have reviewed all documentation for this visit. The documentation on 03/02/23 for the exam, diagnosis, procedures, and orders are all accurate and complete.

## 2023-03-02 ENCOUNTER — Ambulatory Visit: Payer: PPO | Admitting: Gastroenterology

## 2023-03-02 ENCOUNTER — Encounter: Payer: Self-pay | Admitting: Gastroenterology

## 2023-03-02 VITALS — BP 124/70 | HR 91 | Ht 64.0 in | Wt 173.0 lb

## 2023-03-02 DIAGNOSIS — R1319 Other dysphagia: Secondary | ICD-10-CM | POA: Diagnosis not present

## 2023-03-02 DIAGNOSIS — K219 Gastro-esophageal reflux disease without esophagitis: Secondary | ICD-10-CM

## 2023-03-02 NOTE — Patient Instructions (Addendum)
You have been scheduled for an endoscopy. Please follow written instructions given to you at your visit today. If you use inhalers (even only as needed), please bring them with you on the day of your procedure.   Take FDgard three times a day  Decrease Curcumin to 1 capsule daily   Due to recent changes in healthcare laws, you may see the results of your imaging and laboratory studies on MyChart before your provider has had a chance to review them.  We understand that in some cases there may be results that are confusing or concerning to you. Not all laboratory results come back in the same time frame and the provider may be waiting for multiple results in order to interpret others.  Please give [name] 48 hours in order for your provider to thoroughly review all the results before contacting the office for clarification of your results.    I appreciate the  opportunity to care for you  Thank You   Marsa Aris , MD

## 2023-03-08 ENCOUNTER — Telehealth: Payer: Self-pay | Admitting: Family Medicine

## 2023-03-08 NOTE — Telephone Encounter (Signed)
Called patient to schedule Medicare Annual Wellness Visit (AWV). Left message for patient to call back and schedule Medicare Annual Wellness Visit (AWV).  AWV-I: 03/02/2023  Please schedule an appointment at any time with [facility].  If any questions, please contact me at [phone].  Thank you ,  Bernice Cicero Care Guide [facility] AWV TEAM Direct Dial: [phone]    

## 2023-03-09 ENCOUNTER — Encounter: Payer: Self-pay | Admitting: Gastroenterology

## 2023-03-23 ENCOUNTER — Ambulatory Visit: Payer: PPO | Admitting: Cardiology

## 2023-03-25 ENCOUNTER — Ambulatory Visit: Payer: PPO | Admitting: Medical

## 2023-04-04 DIAGNOSIS — H524 Presbyopia: Secondary | ICD-10-CM | POA: Diagnosis not present

## 2023-04-04 DIAGNOSIS — H2513 Age-related nuclear cataract, bilateral: Secondary | ICD-10-CM | POA: Diagnosis not present

## 2023-04-18 DIAGNOSIS — L814 Other melanin hyperpigmentation: Secondary | ICD-10-CM | POA: Diagnosis not present

## 2023-04-18 DIAGNOSIS — D229 Melanocytic nevi, unspecified: Secondary | ICD-10-CM | POA: Diagnosis not present

## 2023-04-18 DIAGNOSIS — D225 Melanocytic nevi of trunk: Secondary | ICD-10-CM | POA: Diagnosis not present

## 2023-04-18 DIAGNOSIS — L578 Other skin changes due to chronic exposure to nonionizing radiation: Secondary | ICD-10-CM | POA: Diagnosis not present

## 2023-04-18 DIAGNOSIS — D485 Neoplasm of uncertain behavior of skin: Secondary | ICD-10-CM | POA: Diagnosis not present

## 2023-04-18 DIAGNOSIS — L720 Epidermal cyst: Secondary | ICD-10-CM | POA: Diagnosis not present

## 2023-04-18 DIAGNOSIS — D1801 Hemangioma of skin and subcutaneous tissue: Secondary | ICD-10-CM | POA: Diagnosis not present

## 2023-04-18 DIAGNOSIS — L821 Other seborrheic keratosis: Secondary | ICD-10-CM | POA: Diagnosis not present

## 2023-04-19 ENCOUNTER — Encounter: Payer: PPO | Admitting: Gastroenterology

## 2023-04-27 DIAGNOSIS — D239 Other benign neoplasm of skin, unspecified: Secondary | ICD-10-CM | POA: Diagnosis not present

## 2023-05-02 NOTE — Progress Notes (Unsigned)
Cardiology Office Note:    Date:  05/04/2023   ID:  Jaime Owens, DOB 1956-07-06, MRN [id number]  PCP:  Avanell Shackleton, NP-C  [facility] Cardiologist:  None  [facility] Electrophysiologist:  None   Referring MD: Avanell Shackleton, NP-C    History of Present Illness:    Jaime Owens is a 67 y.o. female with no significant PMH who presents to clinic for follow-up.  Was seen in 09/2020 for atypical chest pain. Was recommended for Ca score which was not performed. TTE showed LVEF 60-65%, normal GLS, normal RV, no significant valve disease.  Today, the patient overall feels okay. She was seen by her OBGYN and she was told she had an irregular heart beat at that visit. Has rare palpitations but no significant symptoms. No notifications of abnormal rhythm on apple watch. Also with occasional chest twinges that is not exertional in nature. Has some mild SOB with exertion but she has not been as active over the past several months. No orthopnea or PND. Has had some mild LE edema with the heat and prolonged sitting.   BP is always elevated at MD office. Was 120s this morning.    Past Medical History:  Diagnosis Date   Acid reflux    Bladder infection    Chronic cough    Thyroid goiter     Past Surgical History:  Procedure Laterality Date   NO PAST SURGERIES      Current Medications: Current Meds  Medication Sig   acetaminophen (TYLENOL) 650 MG CR tablet Take 650-1,300 mg by mouth every 8 (eight) hours as needed for pain.   albuterol (VENTOLIN HFA) 108 (90 Base) MCG/ACT inhaler Inhale 1-2 puffs into the lungs every 6 (six) hours as needed for wheezing or shortness of breath.   Boswellia Serrata (BOSWELLIA PO) Take 500 mg by mouth daily. Zazzee Boswellia Extract   1-2 capsules daily   CALCIUM PO Take by mouth. Vitamin Code Raw Calcium   2 capsules   Caraway Oil-Levomenthol (FDGARD PO) Take by mouth daily.   Cholecalciferol (D3 PO) Take by mouth. Naturewise D3  1 soft  gel   CRANBERRY PO Take by mouth daily. Zazzee Organic Cranberry   Menthol-Camphor (TIGER BALM ARTHRITIS RUB EX) Apply 1 application topically daily as needed (for pain).   Multiple Vitamins-Minerals (WOMENS MULTIVITAMIN PO) Take by mouth daily. Garden of Life Multivitamin + Probiotic   Multiple Vitamins-Minerals (ZINC PO) Take by mouth. Vitamin Code Raw Zinc  1 capsule   Omega-3 Fatty Acids (FISH OIL PO) Take by mouth daily. Barlean's Fish Oil   2 soft gels daily   TURMERIC CURCUMIN PO Take by mouth. Naturewise Turmeric Curcumin   2 capsules   UNABLE TO FIND Take by mouth daily. Med Name: Zazzee Beet Root extract   UNABLE TO FIND Take by mouth. Med Name: Life Extension Macuguard Ocular Support   1 soft gel   UNABLE TO FIND Take by mouth daily. Med Name: Kappa Magnesium 3 in 1 L-Threonate   2 capsules   UNABLE TO FIND Take by mouth daily. Med Name: 365 Whole [employer] Market Glucosamine Chondroitin & MSM  2 capsules     Allergies:   Codeine, Doxycycline hyclate, Flagyl [metronidazole hcl], Penicillins, and Sulfamethoxazole-trimethoprim   Social History   Socioeconomic History   Marital status: Widowed    Spouse name: Not on file   Number of children: 2   Years of education: Not on file   Highest education level: Not on  file  Occupational History   Occupation: Retired  Tobacco Use   Smoking status: Former    Types: Cigarettes   Smokeless tobacco: Never  Building services engineer Use: Never used  Substance and Sexual Activity   Alcohol use: Yes    Comment: occasionally   Drug use: No   Sexual activity: Yes    Birth control/protection: Post-menopausal  Other Topics Concern   Not on file  Social History Narrative   Not on file   Social Determinants of Health   Financial Resource Strain: Not on file  Food Insecurity: Not on file  Transportation Needs: Not on file  Physical Activity: Not on file  Stress: Not on file  Social Connections: Not on file     Family  History: The patient's family history includes Cancer in an other family member; Colon cancer in her paternal aunt; Diabetes in her father; Diverticulosis in her mother; Heart disease in her maternal grandmother and mother; Hypertension in an other family member; Other in her mother. There is no history of Stomach cancer or Esophageal cancer.  ROS:   Please see the history of present illness.       EKGs/Labs/Other Studies Reviewed:    The following studies were reviewed today: Cardiac Studies & Procedures       ECHOCARDIOGRAM  ECHOCARDIOGRAM COMPLETE 10/21/2020  Narrative ECHOCARDIOGRAM REPORT    Patient Name:   Jaime Owens Date of Exam: 10/21/2020 Medical Rec #:  [id number]      Height:       65.0 in Accession #:    [id number]     Weight:       151.0 lb Date of Birth:  1956/01/08       BSA:          1.755 m Patient Age:    64 years       BP:           144/82 mmHg Patient Gender: F              HR:           77 bpm. Exam Location:  [address]  Procedure: 2D Echo, 3D Echo, Cardiac Doppler, Color Doppler and Strain Analysis  Indications:    R07.9 Chest pain  History:        Patient has no prior history of Echocardiogram examinations. Risk Factors:Former Smoker.  Sonographer:    Garald Braver, RDCS Referring Phys: [id number] Kathlynn Grate Calla Wedekind  IMPRESSIONS   1. Left ventricular ejection fraction, by estimation, is 60 to 65%. Left ventricular ejection fraction by 3D volume is 62 %. The left ventricle has normal function. The left ventricle has no regional wall motion abnormalities. Left ventricular diastolic parameters were normal. The average left ventricular global longitudinal strain is -25.1 %. The global longitudinal strain is normal. 2. Right ventricular systolic function is normal. The right ventricular size is normal. There is normal pulmonary artery systolic pressure. The estimated right ventricular systolic pressure is 31.7 mmHg. 3. The mitral valve is grossly  normal. Trivial mitral valve regurgitation. No evidence of mitral stenosis. 4. The aortic valve is tricuspid. Aortic valve regurgitation is not visualized. No aortic stenosis is present. 5. The inferior vena cava is normal in size with greater than 50% respiratory variability, suggesting right atrial pressure of 3 mmHg.  Conclusion(s)/Recommendation(s): Normal biventricular function without evidence of hemodynamically significant valvular heart disease.  FINDINGS Left Ventricle: Left ventricular ejection fraction, by estimation, is 60 to 65%. Left  ventricular ejection fraction by 3D volume is 62 %. The left ventricle has normal function. The left ventricle has no regional wall motion abnormalities. The average left ventricular global longitudinal strain is -25.1 %. The global longitudinal strain is normal. The left ventricular internal cavity size was normal in size. There is no left ventricular hypertrophy. Left ventricular diastolic parameters were normal.  Right Ventricle: The right ventricular size is normal. No increase in right ventricular wall thickness. Right ventricular systolic function is normal. There is normal pulmonary artery systolic pressure. The tricuspid regurgitant velocity is 2.68 m/s, and with an assumed right atrial pressure of 3 mmHg, the estimated right ventricular systolic pressure is 31.7 mmHg.  Left Atrium: Left atrial size was normal in size.  Right Atrium: Right atrial size was normal in size.  Pericardium: Trivial pericardial effusion is present. Presence of pericardial fat pad.  Mitral Valve: The mitral valve is grossly normal. Trivial mitral valve regurgitation. No evidence of mitral valve stenosis.  Tricuspid Valve: The tricuspid valve is grossly normal. Tricuspid valve regurgitation is trivial. No evidence of tricuspid stenosis.  Aortic Valve: The aortic valve is tricuspid. Aortic valve regurgitation is not visualized. No aortic stenosis is  present.  Pulmonic Valve: The pulmonic valve was grossly normal. Pulmonic valve regurgitation is trivial. No evidence of pulmonic stenosis.  Aorta: The aortic root and ascending aorta are structurally normal, with no evidence of dilitation.  Venous: The inferior vena cava is normal in size with greater than 50% respiratory variability, suggesting right atrial pressure of 3 mmHg.  IAS/Shunts: The atrial septum is grossly normal.   LEFT VENTRICLE PLAX 2D LVIDd:         4.40 cm         Diastology LVIDs:         2.70 cm         LV e' medial:    12.30 cm/s LV PW:         0.80 cm         LV E/e' medial:  7.5 LV IVS:        0.70 cm         LV e' lateral:   11.70 cm/s LVOT diam:     1.90 cm         LV E/e' lateral: 7.8 LV SV:         62 LV SV Index:   35              2D LVOT Area:     2.84 cm        Longitudinal Strain 2D Strain GLS  -27.3 % (A2C): 2D Strain GLS  -23.3 % (A3C): 2D Strain GLS  -24.7 % (A4C): 2D Strain GLS  -25.1 % Avg:  3D Volume EF LV 3D EF:    Left ventricular ejection fraction by 3D volume is 62 %.  3D Volume EF: 3D EF:        62 % LV EDV:       99 ml LV ESV:       37 ml LV SV:        62 ml  RIGHT VENTRICLE RV Basal diam:  2.80 cm RV S prime:     14.60 cm/s TAPSE (M-mode): 3.1 cm RVSP:           31.7 mmHg  LEFT ATRIUM             Index       RIGHT ATRIUM  Index LA diam:        3.60 cm 2.05 cm/m  RA Pressure: 3.00 mmHg LA Vol (A2C):   29.3 ml 16.69 ml/m RA Area:     10.50 cm LA Vol (A4C):   13.4 ml 7.63 ml/m  RA Volume:   21.90 ml  12.48 ml/m LA Biplane Vol: 19.7 ml 11.22 ml/m AORTIC VALVE LVOT Vmax:   97.00 cm/s LVOT Vmean:  65.000 cm/s LVOT VTI:    0.217 m  AORTA Ao Root diam: 3.00 cm Ao Asc diam:  2.90 cm  MITRAL VALVE               TRICUSPID VALVE TR Peak grad:   28.7 mmHg TR Vmax:        268.00 cm/s MV E velocity: 91.70 cm/s  Estimated RAP:  3.00 mmHg MV A velocity: 69.10 cm/s  RVSP:           31.7 mmHg MV E/A ratio:   1.33 SHUNTS Systemic VTI:  0.22 m Systemic Diam: 1.90 cm  Lennie Odor MD Electronically signed by Lennie Odor MD Signature Date/Time: 10/21/2020/11:55:39 AM    Final              EKG: NSR-personally reviewed  Recent Labs: 05/12/2022: TSH 1.15 01/06/2023: BUN 10; Creatinine, Ser 0.68; Hemoglobin 13.9; Platelets 241; Potassium 3.9; Sodium 131  Recent Lipid Panel    Component Value Date/Time   CHOL 227 (H) 10/21/2020 0923   TRIG 113 10/21/2020 0923   HDL 90 10/21/2020 0923   CHOLHDL 2.5 10/21/2020 0923   LDLCALC 118 (H) 10/21/2020 0923     Risk Assessment/Calculations:       Physical Exam:    VS:  BP (!) 162/84   Pulse 85   Ht 5\' 4"  (1.626 m)   Wt 174 lb 6.4 oz (79.1 kg)   SpO2 96%   BMI 29.94 kg/m     Wt Readings from Last 3 Encounters:  05/04/23 174 lb 6.4 oz (79.1 kg)  03/02/23 173 lb (78.5 kg)  01/06/23 172 lb (78 kg)     GEN:  Well nourished, well developed in no acute distress HEENT: Normal NECK: No JVD; No carotid bruits CARDIAC: RRR, 1/6 systolic murmur best heard at RUSB RESPIRATORY:  Clear to auscultation without rales, wheezing or rhonchi  ABDOMEN: Soft, non-tender, non-distended MUSCULOSKELETAL:  No edema; No deformity  SKIN: Warm and dry NEUROLOGIC:  Alert and oriented x 3 PSYCHIATRIC:  Normal affect   ASSESSMENT:    1. Chest pain, unspecified type   2. Pure hypercholesterolemia    PLAN:    In order of problems listed above:  #Atypical Chest Pain: Continues to have intermittent, non-exertional chest pain that is atypical. TTE 2021 with normal BiV function and no significant valve disease. Patient did not schedule Ca score due to cost. Will check to see if ETT is affordable with her insurance. -Check ETT for further evaluation (patient will ensure cost is not prohibitive for her)  #Rare Palpitations: -Rare symptoms and ECG reassuring today -Will monitor symptoms and if increase, can pursue zio at that time -Also can monitor with  apple watch at home  #HLD #Family history CAD: -Declined statin therapy -Ca score too expensive -Discussed mediterranean diet and importance of regular exercise as detailed below  Exercise recommendations: Goal of exercising for at least 30 minutes a day, at least 5 times per week.  Please exercise to a moderate exertion.  This means that while exercising it is difficult to  speak in full sentences, however you are not so short of breath that you feel you must stop, and not so comfortable that you can carry on a full conversation.  Exertion level should be approximately a 5/10, if 10 is the most exertion you can perform.  Diet recommendations: Recommend a heart healthy diet such as the Mediterranean diet.  This diet consists of plant based foods, healthy fats, lean meats, olive oil.  It suggests limiting the intake of simple carbohydrates such as white breads, pastries, and pastas.  It also limits the amount of red meat, wine, and dairy products such as cheese that one should consume on a daily basis.      Medication Adjustments/Labs and Tests Ordered: Current medicines are reviewed at length with the patient today.  Concerns regarding medicines are outlined above.  Orders Placed This Encounter  Procedures   EXERCISE TOLERANCE TEST (ETT)   EKG 12-Lead   EKG 12-Lead   No orders of the defined types were placed in this encounter.   Patient Instructions  Medication Instructions:  Your physician recommends that you continue on your current medications as directed. Please refer to the Current Medication list given to you today.  *If you need a refill on your cardiac medications before your next appointment, please call your pharmacy*   Testing/Procedures: Your physician has requested that you have an exercise tolerance test. For further information please visit [email or web address] Please also follow instruction sheet, as given.   Follow-Up: At [facility], you and your  health needs are our priority.  As part of our continuing mission to provide you with exceptional heart care, we have created designated Provider Care Teams.  These Care Teams include your primary Cardiologist (physician) and Advanced Practice Providers (APPs -  Physician Assistants and Nurse Practitioners) who all work together to provide you with the care you need, when you need it.   Your next appointment:   6 month(s)  Provider:   To be determined     Signed, Meriam Sprague, MD  05/04/2023 5:02 PM    [facility]

## 2023-05-04 ENCOUNTER — Ambulatory Visit: Payer: PPO | Attending: Cardiology | Admitting: Cardiology

## 2023-05-04 ENCOUNTER — Encounter: Payer: Self-pay | Admitting: Cardiology

## 2023-05-04 VITALS — BP 162/84 | HR 85 | Ht 64.0 in | Wt 174.4 lb

## 2023-05-04 DIAGNOSIS — E7800 Pure hypercholesterolemia, unspecified: Secondary | ICD-10-CM | POA: Diagnosis not present

## 2023-05-04 DIAGNOSIS — R079 Chest pain, unspecified: Secondary | ICD-10-CM

## 2023-05-04 NOTE — Patient Instructions (Signed)
Medication Instructions:  Your physician recommends that you continue on your current medications as directed. Please refer to the Current Medication list given to you today.  *If you need a refill on your cardiac medications before your next appointment, please call your pharmacy*   Testing/Procedures: Your physician has requested that you have an exercise tolerance test. For further information please visit [email or web address] Please also follow instruction sheet, as given.   Follow-Up: At [facility], you and your health needs are our priority.  As part of our continuing mission to provide you with exceptional heart care, we have created designated Provider Care Teams.  These Care Teams include your primary Cardiologist (physician) and Advanced Practice Providers (APPs -  Physician Assistants and Nurse Practitioners) who all work together to provide you with the care you need, when you need it.   Your next appointment:   6 month(s)  Provider:   To be determined

## 2023-05-12 DIAGNOSIS — D239 Other benign neoplasm of skin, unspecified: Secondary | ICD-10-CM | POA: Diagnosis not present

## 2023-05-12 DIAGNOSIS — D235 Other benign neoplasm of skin of trunk: Secondary | ICD-10-CM | POA: Diagnosis not present

## 2023-05-16 ENCOUNTER — Ambulatory Visit: Payer: PPO | Admitting: Internal Medicine

## 2023-05-23 ENCOUNTER — Encounter: Payer: Self-pay | Admitting: Internal Medicine

## 2023-05-23 ENCOUNTER — Ambulatory Visit: Payer: PPO | Admitting: Internal Medicine

## 2023-05-23 VITALS — BP 138/80 | HR 74 | Ht 64.0 in | Wt 172.0 lb

## 2023-05-23 DIAGNOSIS — E042 Nontoxic multinodular goiter: Secondary | ICD-10-CM

## 2023-05-23 LAB — TSH: TSH: 1.55 u[IU]/mL (ref 0.35–5.50)

## 2023-05-23 LAB — T4, FREE: Free T4: 1.06 ng/dL (ref 0.60–1.60)

## 2023-05-23 NOTE — Progress Notes (Unsigned)
Name: Jaime Owens  MRN/ DOB: [id number], Apr 22, 1956    Age/ Sex: 67 y.o., female    PCP: Avanell Shackleton, NP-C   Reason for Endocrinology Evaluation: MNG     Date of Initial Endocrinology Evaluation: 12/10/2020    HPI: Jaime Owens is a 67 y.o. female with a past medical history of MNG. The patient presented for initial endocrinology clinic visit on 12/10/2020 for consultative assistance with her MNG/Hypothyroidism .   Pt has been diagnosed with MNG   At the age of 54. She had a benign FNA > 30 yrs ago. An ultrasound in 2016 revealed a left thyroid nodule at 2.3 cm and a right thyroid nodule at 2 cm.  She is S/P benign FNA of both right and left dominant thyroid nodules on 04/30/2015. She is S/P calcified left thyroid nodule 1.1  cm with benign cytology in 2018   She had a prescription for levothyroxine that she did not start in the past, her TSH was normal at 1.6 uIU/mL on her visit to our clinic and we opted not to start  No prior radiation exposure    Mother with goiter    She was told in the past she had high calcium and to stop calcium, labs at our office indicated normal PTH at 24 PG/mL, normal calcium at 9.9 mg/DL (albumin 4.0J/WJ)     SUBJECTIVE:    Today (05/23/23): Jaime Owens is here for follow-up on multinodular goiter .    Denies local neck enlargement, Denies dysphagia , pending EGD has heartburn  Has palpitations, follows with cardiology for murmur Denies diarrhea  Denies tremors except rarely with hunger  Has hand numbness  No Biotin     HISTORY:  Past Medical History:  Past Medical History:  Diagnosis Date   Acid reflux    Bladder infection    Chronic cough    Thyroid goiter    Past Surgical History:  Past Surgical History:  Procedure Laterality Date   NO PAST SURGERIES      Social History:  reports that she has quit smoking. Her smoking use included cigarettes. She has never used smokeless tobacco. She reports current alcohol  use. She reports that she does not use drugs. Family History: family history includes Cancer in an other family member; Colon cancer in her paternal aunt; Diabetes in her father; Diverticulosis in her mother; Heart disease in her maternal grandmother and mother; Hypertension in an other family member; Other in her mother.   HOME MEDICATIONS: Allergies as of 05/23/2023       Reactions   Codeine Nausea Only   Doxycycline Hyclate    Flagyl [metronidazole Hcl] Other (See Comments)   Burning sensation   Penicillins Other (See Comments)   Unknown Has patient had a PCN reaction causing immediate rash, facial/tongue/throat swelling, SOB or lightheadedness with hypotension: Unknown Has patient had a PCN reaction causing severe rash involving mucus membranes or skin necrosis: Unknown Has patient had a PCN reaction that required hospitalization: Unknown Has patient had a PCN reaction occurring within the last 10 years: No If all of the above answers are "NO", then may proceed with Cephalosporin use.   Sulfamethoxazole-trimethoprim Nausea Only        Medication List        Accurate as of May 23, 2023 10:33 AM. If you have any questions, ask your nurse or doctor.          acetaminophen 650 MG CR tablet Commonly known as:  TYLENOL Take 650-1,300 mg by mouth every 8 (eight) hours as needed for pain.   albuterol 108 (90 Base) MCG/ACT inhaler Commonly known as: VENTOLIN HFA Inhale 1-2 puffs into the lungs every 6 (six) hours as needed for wheezing or shortness of breath.   BOSWELLIA PO Take 500 mg by mouth daily. Zazzee Boswellia Extract   1-2 capsules daily   CALCIUM PO Take by mouth. Vitamin Code Raw Calcium   2 capsules   CRANBERRY PO Take by mouth daily. Zazzee Organic Cranberry   D3 PO Take by mouth. Naturewise D3  1 soft gel   FDGARD PO Take by mouth daily.   FISH OIL PO Take by mouth daily. Barlean's Fish Oil   2 soft gels daily   TIGER BALM ARTHRITIS RUB  EX Apply 1 application topically daily as needed (for pain).   TURMERIC CURCUMIN PO Take by mouth. Naturewise Turmeric Curcumin   2 capsules   UNABLE TO FIND Take by mouth daily. Med Name: Zazzee Beet Root extract   UNABLE TO FIND Take by mouth. Med Name: Life Extension Macuguard Ocular Support   1 soft gel   UNABLE TO FIND Take by mouth daily. Med Name: Kappa Magnesium 3 in 1 L-Threonate   2 capsules   UNABLE TO FIND Take by mouth daily. Med Name: 365 Whole [employer] Market Glucosamine Chondroitin & MSM  2 capsules   WOMENS MULTIVITAMIN PO Take by mouth daily. Garden of Life Multivitamin + Probiotic   ZINC PO Take by mouth. Vitamin Code Raw Zinc  1 capsule          REVIEW OF SYSTEMS: A comprehensive ROS was conducted with the patient and is negative except as per HPI     OBJECTIVE:  VS: BP 138/80 (BP Location: Left Arm, Patient Position: Sitting, Cuff Size: Large)   Pulse 74   Ht 5\' 4"  (1.626 m)   Wt 172 lb (78 kg)   SpO2 99%   BMI 29.52 kg/m    Wt Readings from Last 3 Encounters:  05/23/23 172 lb (78 kg)  05/04/23 174 lb 6.4 oz (79.1 kg)  03/02/23 173 lb (78.5 kg)     EXAM: General: Pt appears well and is in NAD  Neck: General: Supple without adenopathy. Thyroid: Thyroid size normal. Left asymmetry noted  Lungs: Clear with good BS bilat   Heart: Auscultation: RRR.  Abdomen: Soft, nontender  Extremities:  BL LE: No pretibial edema   Mental Status: Judgment, insight: Intact Orientation: Oriented to time, place, and person Mood and affect: No depression, anxiety, or agitation     DATA REVIEWED:    Latest Reference Range & Units 05/23/23 11:21  TSH 0.35 - 5.50 uIU/mL 1.55  T4,Free(Direct) 0.60 - 1.60 ng/dL 2.13      Thyroid Ultrasound 05/13/2022  Narrative & Impression  CLINICAL DATA:  Goiter. Reported history of prior thyroid nodule biopsy many years ago.   EXAM: THYROID ULTRASOUND   TECHNIQUE: Ultrasound examination of the  thyroid gland and adjacent soft tissues was performed.   COMPARISON:  Prior thyroid ultrasound 03/27/2015   FINDINGS: Parenchymal Echotexture: Moderately heterogenous   Isthmus: 0.1 cm   Right lobe: 5.7 x 1.8 x 2.1 cm   Left lobe: 5.3 x 2.2 x 2.6 cm   _________________________________________________________   Estimated total number of nodules >/= 1 cm: 5   Number of spongiform nodules >/=  2 cm not described below (TR1): 0   Number of mixed cystic and solid nodules >/= 1.5 cm  not described below (TR2): 0   _________________________________________________________   Nodule #1: Right mid thyroid nodule measures approximately 1.8 x 1.4 x 1.1 cm which is slightly smaller compared to 2.0 x 1.6 x 1.2 cm in 2016. Involution over 5 years is consistent with a benign process.   Nodule # 3: Nodule in the right lower gland measures 1.4 x 1.3 x 1.1 cm compared to 1.8 x 1.4 x 1.2 cm in 2016. Involution over 5 years is consistent with a benign process.   Nodule # 4: Elongated nodule in the right mid gland measures 1.4 x 1.2 x 0.7 cm, insignificantly changed compared to 1.3 x 1.1 x 0.7 cm in 2016.   Nodule # 6: Solid nodule in the inferior aspect of the left thyroid gland measures 2.4 x 2.2 x 1.5 cm, insignificantly changed compared to 2.3 x 2.3 x 1.5 cm in 2016.   No new nodules or suspicious features.   IMPRESSION: 1. There is been no significant interval change in the size, appearance or character of multiple bilateral thyroid nodules dating back to May of 2016. Greater than 5 year stability is consistent with benignity. 2. No new nodules or suspicious features.      FNA left thyroid nodule 01/25/2017  Negative for malignant cells, Bet[place]day Category II ,consistent with benign follicular cells.   ASSESSMENT/PLAN/RECOMMENDATIONS:   Multinodular Goiter :  - Pt is clinically euthyroid  - She had an  ultrasound in 2016 revealed a left thyroid nodule at 2.3 cm and a right  thyroid nodule at 2 cm.  - She is S/P benign FNA of both right and left dominant thyroid nodules on 04/30/2015. - She is S/P FNA of calcified left thyroid nodule 1.1  cm with benign cytology in 2018 -Repeat ultrasound 05/2022 shows stability or involution of the thyroid nodules and no further ultrasounds were recommended -TFTs are normal    F/U in 1 yr     Signed electronically by: Lyndle Herrlich, MD  [facility]  [facility] [address] [place] [place], [place] [address] Phone: [phone] FAX: [phone]   CC: Avanell Shackleton, NP-C [address] [place] [address] Phone: [phone] Fax: [phone]   Return to Endocrinology clinic as below: No future appointments.

## 2023-05-24 ENCOUNTER — Ambulatory Visit: Payer: PPO

## 2023-05-31 ENCOUNTER — Ambulatory Visit: Payer: PPO | Admitting: Cardiology

## 2023-06-21 ENCOUNTER — Telehealth: Payer: Self-pay | Admitting: Radiology

## 2023-06-21 NOTE — Telephone Encounter (Signed)
Contacted Jaime Owens to schedule their annual wellness visit. Patient declined to schedule AWV at this time.  Cheryln Balcom K. CMA

## 2023-06-22 ENCOUNTER — Telehealth: Payer: Self-pay | Admitting: Cardiology

## 2023-06-22 NOTE — Telephone Encounter (Signed)
Pt saw Dr. Shari Prows back in July in the office.  She ordered for her to get a GXT but the pt was holding off to see what the cost of this test would be.  Pt decided due to the cost of this test, she chose not to pursue having this done at this time.  She denies any cardiac complaints at this time.  Pt states she did her research about who she wanted to establish care with as her new General Cardiologist, being Dr. Shari Prows is no longer with the practice.   Pt said Dr. Shari Prows suggested for her to see Dr. Cristal Deer at our [facility] location in 6 months, if she agreed to this.  Pt states she does want to pursue seeing Dr. Cristal Deer in 6 months as her new Cardiologist.   Pt is aware that I will have Dr. Di Kindle scheduler reach out to her soon to arrange her 6 month follow-up appt with them.  Advised the pt if she needs anything before then, to please reach out to the office about this.  Pt verbalized understanding and agrees with this plan.

## 2023-06-22 NOTE — Telephone Encounter (Signed)
Called patient to schedule her GXT test. Patient didn't want test due to the cost of the test.

## 2023-07-06 ENCOUNTER — Encounter (HOSPITAL_BASED_OUTPATIENT_CLINIC_OR_DEPARTMENT_OTHER): Payer: Self-pay

## 2023-07-25 DIAGNOSIS — L720 Epidermal cyst: Secondary | ICD-10-CM | POA: Diagnosis not present

## 2023-07-25 DIAGNOSIS — L820 Inflamed seborrheic keratosis: Secondary | ICD-10-CM | POA: Diagnosis not present

## 2023-08-08 DIAGNOSIS — L739 Follicular disorder, unspecified: Secondary | ICD-10-CM | POA: Diagnosis not present

## 2023-10-17 ENCOUNTER — Telehealth: Payer: Self-pay | Admitting: Cardiology

## 2023-10-17 NOTE — Telephone Encounter (Signed)
Spoke with patient who has never been seen by cardiologist in practice Advised no sooner appointments available, if worsening symptoms go to ED for evaluation Did send message to scheduling team to see about getting her added to cancellation list

## 2023-10-17 NOTE — Telephone Encounter (Signed)
Patient said that she has been experiencing some twinges in her chest. Been more frequent today. Doesn't know how to explain feeling. Says it is not chest pain

## 2023-10-19 ENCOUNTER — Other Ambulatory Visit: Payer: Self-pay

## 2023-10-19 ENCOUNTER — Other Ambulatory Visit (HOSPITAL_BASED_OUTPATIENT_CLINIC_OR_DEPARTMENT_OTHER): Payer: Self-pay

## 2023-10-19 ENCOUNTER — Encounter (HOSPITAL_BASED_OUTPATIENT_CLINIC_OR_DEPARTMENT_OTHER): Payer: Self-pay | Admitting: Emergency Medicine

## 2023-10-19 ENCOUNTER — Emergency Department (HOSPITAL_BASED_OUTPATIENT_CLINIC_OR_DEPARTMENT_OTHER)
Admission: EM | Admit: 2023-10-19 | Discharge: 2023-10-19 | Disposition: A | Discharge status: Home / Self Care | Payer: PPO | Attending: Emergency Medicine | Admitting: Emergency Medicine

## 2023-10-19 DIAGNOSIS — I499 Cardiac arrhythmia, unspecified: Secondary | ICD-10-CM | POA: Diagnosis present

## 2023-10-19 DIAGNOSIS — I493 Ventricular premature depolarization: Secondary | ICD-10-CM | POA: Insufficient documentation

## 2023-10-19 DIAGNOSIS — F17210 Nicotine dependence, cigarettes, uncomplicated: Secondary | ICD-10-CM | POA: Insufficient documentation

## 2023-10-19 DIAGNOSIS — Z79899 Other long term (current) drug therapy: Secondary | ICD-10-CM | POA: Diagnosis not present

## 2023-10-19 DIAGNOSIS — I491 Atrial premature depolarization: Secondary | ICD-10-CM | POA: Insufficient documentation

## 2023-10-19 LAB — CBC
HCT: 41.4 % (ref 36.0–46.0)
Hemoglobin: 13.8 g/dL (ref 12.0–15.0)
MCH: 29.1 pg (ref 26.0–34.0)
MCHC: 33.3 g/dL (ref 30.0–36.0)
MCV: 87.3 fL (ref 80.0–100.0)
Platelets: 246 10*3/uL (ref 150–400)
RBC: 4.74 MIL/uL (ref 3.87–5.11)
RDW: 12.2 % (ref 11.5–15.5)
WBC: 6.1 10*3/uL (ref 4.0–10.5)
nRBC: 0 % (ref 0.0–0.2)

## 2023-10-19 LAB — BASIC METABOLIC PANEL
Anion gap: 8 (ref 5–15)
BUN: 12 mg/dL (ref 8–23)
CO2: 27 mmol/L (ref 22–32)
Calcium: 9.7 mg/dL (ref 8.9–10.3)
Chloride: 100 mmol/L (ref 98–111)
Creatinine, Ser: 0.81 mg/dL (ref 0.44–1.00)
GFR, Estimated: >60 mL/min (ref >60)
Glucose, Bld: 100 mg/dL — ABNORMAL HIGH (ref 70–99)
Potassium: 3.8 mmol/L (ref 3.5–5.1)
Sodium: 135 mmol/L (ref 135–145)

## 2023-10-19 LAB — T4, FREE: Free T4: 1.08 ng/dL (ref 0.61–1.12)

## 2023-10-19 LAB — TROPONIN I (HIGH SENSITIVITY): Troponin I (High Sensitivity): 2 ng/L (ref <18)

## 2023-10-19 LAB — MAGNESIUM: Magnesium: 2 mg/dL (ref 1.7–2.4)

## 2023-10-19 LAB — TSH: TSH: 1.659 u[IU]/mL (ref 0.350–4.500)

## 2023-10-19 MED ORDER — METOPROLOL SUCCINATE ER 25 MG PO TB24
25.0000 mg | ORAL_TABLET | Freq: Every day | ORAL | 0 refills | Status: DC
  Filled 2023-10-19: qty 30, 30d supply, fill #0

## 2023-10-19 NOTE — ED Provider Notes (Signed)
[place] EMERGENCY DEPARTMENT AT [facility] Provider Note   CSN: [id number] Arrival date & time: 10/19/23  0865     History  Chief Complaint  Patient presents with   Irregular Heart Beat    Jaime Owens is a 67 y.o. female with past medical history significant for thyroid goiter, acid reflux who presents concern for irregular heartbeats.  Patient reports that for around 2 to 3 days she has been having feeling of heart beating heavily, associated occasionally with some tingling on the left side of the neck/left forehead region.  She denies any chest pain, shortness of breath.  She denies any previous history of blood clot, recent travel, she reports that she has some recordings on her phone which show some premature beats.  No previous history of ACS, stroke.  She denies any history of diabetes, she does not smoke cigarettes.  HPI     Home Medications Prior to Admission medications   Medication Sig Start Date End Date Taking? Authorizing Provider  metoprolol succinate (TOPROL-XL) 25 MG 24 hr tablet Take 1 tablet (25 mg total) by mouth daily. 10/19/23  Yes Elmer Boutelle H, PA-C  acetaminophen (TYLENOL) 650 MG CR tablet Take 650-1,300 mg by mouth every 8 (eight) hours as needed for pain.    [provider]  albuterol (VENTOLIN HFA) 108 (90 Base) MCG/ACT inhaler Inhale 1-2 puffs into the lungs every 6 (six) hours as needed for wheezing or shortness of breath. 01/06/23   Meredeth Ide, Conner M, PA-C  Boswellia Serrata (BOSWELLIA PO) Take 500 mg by mouth daily. Zazzee Boswellia Extract   1-2 capsules daily    [provider]  CALCIUM PO Take by mouth. Vitamin Code Raw Calcium   2 capsules    [provider]  Caraway Oil-Levomenthol (FDGARD PO) Take by mouth daily.    [provider]  Cholecalciferol (D3 PO) Take by mouth. Naturewise D3  1 soft gel    [provider]  CRANBERRY PO Take by mouth daily. Zazzee Organic Cranberry     [provider]  Menthol-Camphor (TIGER BALM ARTHRITIS RUB EX) Apply 1 application topically daily as needed (for pain).    [provider]  Multiple Vitamins-Minerals (WOMENS MULTIVITAMIN PO) Take by mouth daily. Garden of Life Multivitamin + Probiotic    [provider]  Multiple Vitamins-Minerals (ZINC PO) Take by mouth. Vitamin Code Raw Zinc  1 capsule    [provider]  Omega-3 Fatty Acids (FISH OIL PO) Take by mouth daily. Barlean's Fish Oil   2 soft gels daily    [provider]  TURMERIC CURCUMIN PO Take by mouth. Naturewise Turmeric Curcumin   2 capsules    [provider]  UNABLE TO FIND Take by mouth daily. Med Name: Zazzee Beet Root extract    [provider]  UNABLE TO FIND Take by mouth. Med Name: Life Extension Macuguard Ocular Support   1 soft gel    [provider]  UNABLE TO FIND Take by mouth daily. Med Name: Kappa Magnesium 3 in 1 L-Threonate   2 capsules    [provider]  UNABLE TO FIND Take by mouth daily. Med Name: 365 [employer] Glucosamine Chondroitin & MSM  2 capsules    [provider]      Allergies    Codeine, Doxycycline hyclate, Flagyl [metronidazole hcl], Penicillins, and Sulfamethoxazole-trimethoprim    Review of Systems   Review of Systems  All other systems reviewed and are negative.  Physical Exam Updated Vital Signs BP (!) 150/82   Pulse 74   Temp 98.8 F (37.1 C)   Resp (!) 21   SpO2 95%  Physical Exam Vitals and nursing note reviewed.  Constitutional:      General: She is not in acute distress.    Appearance: Normal appearance.  HENT:     Head: Normocephalic and atraumatic.  Eyes:     General:        Right eye: No discharge.        Left eye: No discharge.  Cardiovascular:     Rate and Rhythm: Normal rate. Rhythm irregular.     Heart sounds: No murmur heard.    No friction rub. No gallop.     Comments: Frequent PVCs/PACs  noted Pulmonary:     Effort: Pulmonary effort is normal.     Breath sounds: Normal breath sounds.  Abdominal:     General: Bowel sounds are normal.     Palpations: Abdomen is soft.  Skin:    General: Skin is warm and dry.     Capillary Refill: Capillary refill takes less than 2 seconds.  Neurological:     Mental Status: She is alert and oriented to person, place, and time.  Psychiatric:        Mood and Affect: Mood normal.        Behavior: Behavior normal.     ED Results / Procedures / Treatments   Labs (all labs ordered are listed, but only abnormal results are displayed) Labs Reviewed  BASIC METABOLIC PANEL - Abnormal; Notable for the following components:      Result Value   Glucose, Bld 100 (*)    All other components within normal limits  CBC  MAGNESIUM  TSH  T4, FREE  TROPONIN I (HIGH SENSITIVITY)    EKG None  Radiology No results found.  Procedures Procedures    Medications Ordered in ED Medications - No data to display  ED Course/ Medical Decision Making/ A&P                                 Medical Decision Making Amount and/or Complexity of Data Reviewed Labs: ordered.   This patient is a 67 y.o. female  who presents to the ED for concern of irregular heart beat.   Differential diagnoses prior to evaluation: The emergent differential diagnosis includes, but is not limited to,  Pain or anxiety, fever, sepsis, anemia, drug/ETOH intoxication, hyperthyroidism, PE, CHF, tamponade, valvular disease, hyper/hypoglycemia, MI, Pheochromocytoma, other electrolyte abnormality, cardiac conduction disorder. This is not an exhaustive differential.   Past Medical History / Co-morbidities / Social History: History of thyroid goiter, hypercalcemia, otherwise overall noncontributory past medical history  Physical Exam: Physical exam performed. The pertinent findings include: Somewhat hypertensive in the ED, blood pressure on arrival 188/97, has been somewhat  irregular as far as blood pressure, 1 reading at 127/69, but most recent at time of discharge 150/82.  She reports she does not normally take any blood pressure medication.  She has had normal heart rate with frequent PVCs/PACs, otherwise normal rhythm.  She is stable oxygen saturation on room air no respiratory abnormalities.  Lab Tests/Imaging studies: I personally interpreted labs/imaging and the pertinent results include: BMP unremarkable, troponin normal, at 2 with no active chest pain today, symptoms ongoing for several days.  Normal TSH, normal magnesium, normal CBC.  EKG with frequent PVCs/PACs.   Cardiac  monitoring: EKG obtained and interpreted by myself and attending physician which shows: Normal sinus rhythm, frequent PVCs/PACs   Medications: Discussed discharge with plan for close cardiology follow-up versus starting Toprol all 25 mg XL daily to help with symptoms, patient opts for blood pressure medication to take as needed, and will follow-up closely with cardiologist   Disposition: After consideration of the diagnostic results and the patients response to treatment, I feel that patient with reassuring workup other than frequent PVCs / PACs -- stable for discharge at this time with plan for metoprolol, cardiology follow up .   emergency department workup does not suggest an emergent condition requiring admission or immediate intervention beyond what has been performed at this time. The plan is: as above. The patient is safe for discharge and has been instructed to return immediately for worsening symptoms, change in symptoms or any other concerns.  Final Clinical Impression(s) / ED Diagnoses Final diagnoses:  PVC's (premature ventricular contractions)  Premature atrial beats    Rx / DC Orders ED Discharge Orders          Ordered    metoprolol succinate (TOPROL-XL) 25 MG 24 hr tablet  Daily        10/19/23 [address], [place], PA-C 10/19/23  1229    Linwood Dibbles, MD 10/19/23 1451

## 2023-10-19 NOTE — ED Triage Notes (Signed)
Pt c/o "irregular heartbeat" described as extra, heavy beat for "quite awhile> endorses constant x 2 days pta, and "erratically" today

## 2023-10-19 NOTE — Discharge Instructions (Addendum)
You can use the blood pressure medication I have prescribed up to once daily as needed. Please follow up with your cardiologist in January as planned. Your labwork today was reassuring and did not suggest another underlying cause of your abnormal heart beats.  I have given you only a one month supply, if this medication is helpful, you can ask your PCP or cardiologist to refill it.

## 2023-11-04 ENCOUNTER — Telehealth: Payer: Self-pay | Admitting: *Deleted

## 2023-11-04 NOTE — Progress Notes (Signed)
 Transition Care Management Unsuccessful Follow-up Telephone Call  Date of discharge and from where:  [facility]  10/19/2023  Attempts:  1st Attempt  Reason for unsuccessful TCM follow-up call:  Left voice message

## 2023-11-07 ENCOUNTER — Other Ambulatory Visit (HOSPITAL_BASED_OUTPATIENT_CLINIC_OR_DEPARTMENT_OTHER): Payer: PPO

## 2023-11-07 ENCOUNTER — Encounter (HOSPITAL_BASED_OUTPATIENT_CLINIC_OR_DEPARTMENT_OTHER): Payer: Self-pay | Admitting: Cardiology

## 2023-11-07 ENCOUNTER — Ambulatory Visit (HOSPITAL_BASED_OUTPATIENT_CLINIC_OR_DEPARTMENT_OTHER): Payer: PPO | Admitting: Cardiology

## 2023-11-07 ENCOUNTER — Telehealth: Payer: Self-pay | Admitting: *Deleted

## 2023-11-07 VITALS — BP 124/71 | HR 83 | Resp 17 | Ht 64.0 in | Wt 174.0 lb

## 2023-11-07 DIAGNOSIS — Z712 Person consulting for explanation of examination or test findings: Secondary | ICD-10-CM

## 2023-11-07 DIAGNOSIS — I493 Ventricular premature depolarization: Secondary | ICD-10-CM

## 2023-11-07 DIAGNOSIS — R002 Palpitations: Secondary | ICD-10-CM | POA: Diagnosis not present

## 2023-11-07 NOTE — Progress Notes (Signed)
 Transition Care Management Follow-up Telephone Call Date of discharge and from where: [facility]  10/19/2023 How have you been since you were released from the hospital? Feeling better  Any questions or concerns? No  Items Reviewed: Did the pt receive and understand the discharge instructions provided? Yes Medications obtained and verified? Yes  Other? No  Any new allergies since your discharge? No  Dietary orders reviewed? Yes Do you have support at home? Yes     Follow up appointments reviewed:  [facility] f/u appt confirmed? No   Are transportation arrangements needed? No  If their condition worsens, is the pt aware to call PCP or go to the Emergency Dept.? Yes Was the patient provided with contact information for the PCP's office or ED? Yes Was to pt encouraged to call back with questions or concerns? Yes

## 2023-11-07 NOTE — Progress Notes (Signed)
  Cardiology Office Note:  .   Date:  11/07/2023  ID:  Jaime Owens, DOB [birth year], MRN [id number] PCP: Geofm Glade PARAS, MD  [facility] Providers Cardiologist:  Shelda Bruckner, MD {  History of Present Illness: .   Jaime Owens is a 68 y.o. female with history of atypical chest pain, palpitations. She was previously followed by Dr. Hobart and established care with me on 11/07/23.  Pertinent CV history: seen for atypical chest pain in 2021. Echo normal. Seen again 05/2023 for irregular heart beat noted on physical exam by her OBGYN. Noted family history of CAD. Declined Ca score due to cost. Ordered for ETT for atypical chest pain in 2024 but not pursued by patient.  Today: Reviewed her ER visit from 10/19/23. Found to have PVCs. Frequency has been increasing, now feeling daily or multiple times per day.. She has not taken the metoprolol  ordered by the ER as she read about side effects. We discussed potential triggers, symptoms, management strategies. No change to caffeine intake (minimal), only occasional alcohol. Notes more stress around the holidays.   Has had lightheadness but no syncope.  ROS: Denies shortness of breath at rest or with normal exertion. No PND, orthopnea, LE edema or unexpected weight gain. No syncope. ROS otherwise negative except as noted.   Studies Reviewed: SABRA    EKG:       Physical Exam:   VS:  BP 124/71 Comment: home  Pulse 83   Resp 17   Ht 5' 4 (1.626 m)   Wt 174 lb (78.9 kg)   SpO2 98%   BMI 29.87 kg/m    Wt Readings from Last 3 Encounters:  11/07/23 174 lb (78.9 kg)  05/23/23 172 lb (78 kg)  05/04/23 174 lb 6.4 oz (79.1 kg)    GEN: Well nourished, well developed in no acute distress HEENT: Normal, moist mucous membranes NECK: No JVD CARDIAC: regular rhythm, normal S1 and S2, no rubs or gallops. No murmur. VASCULAR: Radial and DP pulses 2+ bilaterally. No carotid bruits RESPIRATORY:  Clear to auscultation without rales, wheezing  or rhonchi  ABDOMEN: Soft, non-tender, non-distended MUSCULOSKELETAL:  Ambulates independently SKIN: Warm and dry, no edema NEUROLOGIC:  Alert and oriented x 3. No focal neuro deficits noted. PSYCHIATRIC:  Normal affect    ASSESSMENT AND PLAN: .    Palpitations PVCs -reviewed her ER testing. Thyroid , electrolytes, hsTn normal. ECG NSR -we discussed the potential causes of fast heart rates and palpitations today. We discussed that there can be other rhythm issues, from either the top or bottom of the heart, that are abnormal rhythms. Discussed how we evaluate for these.  -after shared decision making, will pursue 7 day Zio to evaluation burden and symptoms -she would like to avoid medications if possible. No clear lifestyle triggers -if burden is low and she feels that she can manage symptoms, does not need medications unless symptoms worsen. However, if burden is high, would get echo and discuss medication suppression. Discussed that EP referral/ablation is option if she cannot tolerate/doesn't get improvement with medications  Dispo: 6 weeks or sooner as needed  Signed, Shelda Bruckner, MD   Shelda Bruckner, MD, PhD, [facility]  [facility]    Heart & Vascular at [facility] at [facility] [address] [place], [place] [address] [phone]

## 2023-11-07 NOTE — Patient Instructions (Signed)
 Medication Instructions:  Your physician recommends that you continue on your current medications as directed. Please refer to the Current Medication list given to you today.  *If you need a refill on your cardiac medications before your next appointment, please call your pharmacy*   Testing/Procedures: Your physician recommends a Zio monitor for 7 days.   Follow-Up: At [facility], you and your health needs are our priority.  As part of our continuing mission to provide you with exceptional heart care, we have created designated Provider Care Teams.  These Care Teams include your primary Cardiologist (physician) and Advanced Practice Providers (APPs -  Physician Assistants and Nurse Practitioners) who all work together to provide you with the care you need, when you need it.  We recommend signing up for the patient portal called MyChart.  Sign up information is provided on this After Visit Summary.  MyChart is used to connect with patients for Virtual Visits (Telemedicine).  Patients are able to view lab/test results, encounter notes, upcoming appointments, etc.  Non-urgent messages can be sent to your provider as well.   To learn more about what you can do with MyChart, go to [email or web address].    Your next appointment:   6 week(s)  Provider:   Shelda Bruckner, MD or Reche Finder, NP    Other Instructions Your physician has recommended that you wear a Zio monitor.   This monitor is a medical device that records the heart's electrical activity. Doctors most often use these monitors to diagnose arrhythmias. Arrhythmias are problems with the speed or rhythm of the heartbeat. The monitor is a small device applied to your chest. You can wear one while you do your normal daily activities. While wearing this monitor if you have any symptoms to push the button and record what you felt. Once you have worn this monitor for the period of time provider prescribed (Usually 14  days), you will return the monitor device in the postage paid box. Once it is returned they will download the data collected and provide us  with a report which the provider will then review and we will call you with those results. Important tips:  Avoid showering during the first 24 hours of wearing the monitor. Avoid excessive sweating to help maximize wear time. Do not submerge the device, no hot tubs, and no swimming pools. Keep any lotions or oils away from the patch. After 24 hours you may shower with the patch on. Take brief showers with your back facing the shower head.  Do not remove patch once it has been placed because that will interrupt data and decrease adhesive wear time. Push the button when you have any symptoms and write down what you were feeling. Once you have completed wearing your monitor, remove and place into box which has postage paid and place in your outgoing mailbox.  If for some reason you have misplaced your box then call our office and we can provide another box and/or mail it off for you.

## 2023-11-22 DIAGNOSIS — R002 Palpitations: Secondary | ICD-10-CM | POA: Diagnosis not present

## 2023-12-23 ENCOUNTER — Ambulatory Visit (HOSPITAL_BASED_OUTPATIENT_CLINIC_OR_DEPARTMENT_OTHER): Payer: PPO | Admitting: Family

## 2023-12-23 ENCOUNTER — Other Ambulatory Visit (HOSPITAL_BASED_OUTPATIENT_CLINIC_OR_DEPARTMENT_OTHER): Payer: Self-pay

## 2023-12-23 ENCOUNTER — Encounter (HOSPITAL_BASED_OUTPATIENT_CLINIC_OR_DEPARTMENT_OTHER): Payer: Self-pay | Admitting: Family

## 2023-12-23 VITALS — BP 128/69 | HR 110 | Ht 65.0 in | Wt 174.1 lb

## 2023-12-23 DIAGNOSIS — R002 Palpitations: Secondary | ICD-10-CM | POA: Diagnosis not present

## 2023-12-23 DIAGNOSIS — I491 Atrial premature depolarization: Secondary | ICD-10-CM

## 2023-12-23 DIAGNOSIS — I471 Supraventricular tachycardia, unspecified: Secondary | ICD-10-CM | POA: Diagnosis not present

## 2023-12-23 MED ORDER — METOPROLOL TARTRATE 25 MG PO TABS
12.5000 mg | ORAL_TABLET | Freq: Two times a day (BID) | ORAL | 2 refills | Status: AC | PRN
  Filled 2023-12-23: qty 30, 15d supply, fill #0

## 2023-12-23 NOTE — Progress Notes (Signed)
 Cardiology Office Note:  .   Date:  12/29/2023  ID:  Jaime Owens, DOB 04/22/1956, MRN [id number] PCP: Pincus Sanes, MD  [facility] Providers Cardiologist:  Jodelle Red, MD    History of Present Illness: .   Jaime Owens is a 68 y.o. female with history of atypical chest pain, palpitations.  Previously patient of Dr. Shari Prows having since established with Dr. Cristal Deer.  ED visit 10/19/2023 with PVC.  She had not started metoprolol due to concerns about side effects.  Seen by Dr. Cristal Deer 11/07/2023.  14-day ZIO placed in clinic.  She preferred to avoid medications if possible.  Presented for follow-up with her daughter.  Since last seen has switched to decaf tea.  She switched to decaf coffee about a year ago.  Home BP has been well-controlled with readings this morning 128/69.  She notes her palpitations are improved from previous.  We reviewed monitor with brief episodes of SVT as well as concern for atrial flutter.   ROS: Please see the history of present illness.    All other systems reviewed and are negative.   Studies Reviewed: .        Cardiac Studies & Procedures   ______________________________________________________________________________________________     ECHOCARDIOGRAM  ECHOCARDIOGRAM COMPLETE 10/21/2020  Narrative ECHOCARDIOGRAM REPORT    Patient Name:   Jaime Owens Date of Exam: 10/21/2020 Medical Rec #:  [id number]      Height:       65.0 in Accession #:    [id number]     Weight:       151.0 lb Date of Birth:  07-06-1956       BSA:          1.755 m Patient Age:    64 years       BP:           144/82 mmHg Patient Gender: F              HR:           77 bpm. Exam Location:  [address]  Procedure: 2D Echo, 3D Echo, Cardiac Doppler, Color Doppler and Strain Analysis  Indications:    R07.9 Chest pain  History:        Patient has no prior history of Echocardiogram examinations. Risk Factors:Former Smoker.  Sonographer:     Garald Braver, RDCS Referring Phys: [id number] Kathlynn Grate [place]  IMPRESSIONS   1. Left ventricular ejection fraction, by estimation, is 60 to 65%. Left ventricular ejection fraction by 3D volume is 62 %. The left ventricle has normal function. The left ventricle has no regional wall motion abnormalities. Left ventricular diastolic parameters were normal. The average left ventricular global longitudinal strain is -25.1 %. The global longitudinal strain is normal. 2. Right ventricular systolic function is normal. The right ventricular size is normal. There is normal pulmonary artery systolic pressure. The estimated right ventricular systolic pressure is 31.7 mmHg. 3. The mitral valve is grossly normal. Trivial mitral valve regurgitation. No evidence of mitral stenosis. 4. The aortic valve is tricuspid. Aortic valve regurgitation is not visualized. No aortic stenosis is present. 5. The inferior vena cava is normal in size with greater than 50% respiratory variability, suggesting right atrial pressure of 3 mmHg.  Conclusion(s)/Recommendation(s): Normal biventricular function without evidence of hemodynamically significant valvular heart disease.  FINDINGS Left Ventricle: Left ventricular ejection fraction, by estimation, is 60 to 65%. Left ventricular ejection fraction by 3D volume is 62 %. The left ventricle has  normal function. The left ventricle has no regional wall motion abnormalities. The average left ventricular global longitudinal strain is -25.1 %. The global longitudinal strain is normal. The left ventricular internal cavity size was normal in size. There is no left ventricular hypertrophy. Left ventricular diastolic parameters were normal.  Right Ventricle: The right ventricular size is normal. No increase in right ventricular wall thickness. Right ventricular systolic function is normal. There is normal pulmonary artery systolic pressure. The tricuspid regurgitant velocity is 2.68 m/s,  and with an assumed right atrial pressure of 3 mmHg, the estimated right ventricular systolic pressure is 31.7 mmHg.  Left Atrium: Left atrial size was normal in size.  Right Atrium: Right atrial size was normal in size.  Pericardium: Trivial pericardial effusion is present. Presence of pericardial fat pad.  Mitral Valve: The mitral valve is grossly normal. Trivial mitral valve regurgitation. No evidence of mitral valve stenosis.  Tricuspid Valve: The tricuspid valve is grossly normal. Tricuspid valve regurgitation is trivial. No evidence of tricuspid stenosis.  Aortic Valve: The aortic valve is tricuspid. Aortic valve regurgitation is not visualized. No aortic stenosis is present.  Pulmonic Valve: The pulmonic valve was grossly normal. Pulmonic valve regurgitation is trivial. No evidence of pulmonic stenosis.  Aorta: The aortic root and ascending aorta are structurally normal, with no evidence of dilitation.  Venous: The inferior vena cava is normal in size with greater than 50% respiratory variability, suggesting right atrial pressure of 3 mmHg.  IAS/Shunts: The atrial septum is grossly normal.   LEFT VENTRICLE PLAX 2D LVIDd:         4.40 cm         Diastology LVIDs:         2.70 cm         LV e' medial:    12.30 cm/s LV PW:         0.80 cm         LV E/e' medial:  7.5 LV IVS:        0.70 cm         LV e' lateral:   11.70 cm/s LVOT diam:     1.90 cm         LV E/e' lateral: 7.8 LV SV:         62 LV SV Index:   35              2D LVOT Area:     2.84 cm        Longitudinal Strain 2D Strain GLS  -27.3 % (A2C): 2D Strain GLS  -23.3 % (A3C): 2D Strain GLS  -24.7 % (A4C): 2D Strain GLS  -25.1 % Avg:  3D Volume EF LV 3D EF:    Left ventricular ejection fraction by 3D volume is 62 %.  3D Volume EF: 3D EF:        62 % LV EDV:       99 ml LV ESV:       37 ml LV SV:        62 ml  RIGHT VENTRICLE RV Basal diam:  2.80 cm RV S prime:     14.60 cm/s TAPSE (M-mode):  3.1 cm RVSP:           31.7 mmHg  LEFT ATRIUM             Index       RIGHT ATRIUM           Index LA diam:  3.60 cm 2.05 cm/m  RA Pressure: 3.00 mmHg LA Vol (A2C):   29.3 ml 16.69 ml/m RA Area:     10.50 cm LA Vol (A4C):   13.4 ml 7.63 ml/m  RA Volume:   21.90 ml  12.48 ml/m LA Biplane Vol: 19.7 ml 11.22 ml/m AORTIC VALVE LVOT Vmax:   97.00 cm/s LVOT Vmean:  65.000 cm/s LVOT VTI:    0.217 m  AORTA Ao Root diam: 3.00 cm Ao Asc diam:  2.90 cm  MITRAL VALVE               TRICUSPID VALVE TR Peak grad:   28.7 mmHg TR Vmax:        268.00 cm/s MV E velocity: 91.70 cm/s  Estimated RAP:  3.00 mmHg MV A velocity: 69.10 cm/s  RVSP:           31.7 mmHg MV E/A ratio:  1.33 SHUNTS Systemic VTI:  0.22 m Systemic Diam: 1.90 cm  Lennie Odor MD Electronically signed by Lennie Odor MD Signature Date/Time: 10/21/2020/11:55:39 AM    Final          ______________________________________________________________________________________________      Risk Assessment/Calculations:    CHA2DS2-VASc Score = 2   This indicates a 2.2% annual risk of stroke. The patient's score is based upon: CHF History: 0 HTN History: 0 Diabetes History: 0 Stroke History: 0 Vascular Disease History: 0 Age Score: 1 Gender Score: 1            Physical Exam:   VS:  BP 128/69   Pulse (!) 110   Ht 5\' 5"  (1.651 m)   Wt 174 lb 1.6 oz (79 kg)   SpO2 97%   BMI 28.97 kg/m    Wt Readings from Last 3 Encounters:  12/23/23 174 lb 1.6 oz (79 kg)  11/07/23 174 lb (78.9 kg)  05/23/23 172 lb (78 kg)    Vitals:   12/23/23 1422 12/23/23 2109  BP: (!) 158/96 128/69  Pulse: (!) 110   Height: 5\' 5"  (1.651 m)   Weight: 174 lb 1.6 oz (79 kg)   SpO2: 97%   BMI (Calculated): 28.97      GEN: Well nourished, well developed in no acute distress NECK: No JVD; No carotid bruits CARDIAC: RRR, no murmurs, rubs, gallops RESPIRATORY:  Clear to auscultation without rales, wheezing or rhonchi   ABDOMEN: Soft, non-tender, non-distended EXTREMITIES:  No edema; No deformity   ASSESSMENT AND PLAN: .    Palpitations / SVT / PAC  / ?Atrial flutter - Symptoms improved since last OV with transition to decaf tea. Monitor with triggered episodes associated with PAC with overall PAC burden 6.1%. Brief episode of atrial flutter vs SVT. Reviewed with Dr. Cristal Deer. Given low burden and lack of symptoms, defer OAC at this time.  Plan for echocardiogram to rule out valvular abnormalities. Rx Metoprolol tartrate to use PRN for palpitations       Dispo: follow up 6 mos  Signed, Alver Sorrow, NP

## 2023-12-23 NOTE — Patient Instructions (Signed)
 Medication Instructions:  START Metoprolol Tartrate AS NEEDED for palpitations  *If you need a refill on your cardiac medications before your next appointment, please call your pharmacy*  Testing/Procedures: Your physician has requested that you have an echocardiogram. Echocardiography is a painless test that uses sound waves to create images of your heart. It provides your doctor with information about the size and shape of your heart and how well your heart's chambers and valves are working. This procedure takes approximately one hour. There are no restrictions for this procedure. Please do NOT wear cologne, perfume, aftershave, or lotions (deodorant is allowed). Please arrive 15 minutes prior to your appointment time.  Please note: We ask at that you not bring children with you during ultrasound (echo/ vascular) testing. Due to room size and safety concerns, children are not allowed in the ultrasound rooms during exams. Our front office staff cannot provide observation of children in our lobby area while testing is being conducted. An adult accompanying a patient to their appointment will only be allowed in the ultrasound room at the discretion of the ultrasound technician under special circumstances. We apologize for any inconvenience. o   Follow-Up: At [facility], you and your health needs are our priority.  As part of our continuing mission to provide you with exceptional heart care, we have created designated Provider Care Teams.  These Care Teams include your primary Cardiologist (physician) and Advanced Practice Providers (APPs -  Physician Assistants and Nurse Practitioners) who all work together to provide you with the care you need, when you need it.  We recommend signing up for the patient portal called "MyChart".  Sign up information is provided on this After Visit Summary.  MyChart is used to connect with patients for Virtual Visits (Telemedicine).  Patients are able to view  lab/test results, encounter notes, upcoming appointments, etc.  Non-urgent messages can be sent to your provider as well.   To learn more about what you can do with MyChart, go to [email or web address].    Your next appointment:   6 month(s)  Provider:   Jodelle Red, MD or Gillian Shields, NP    Other Instructions  To prevent palpitations: Make sure you are adequately hydrated.  Avoid and/or limit caffeine containing beverages like soda or tea. Exercise regularly.  Manage stress well. Some over the counter medications can cause palpitations such as Benadryl, AdvilPM, TylenolPM. Regular Advil or Tylenol do not cause palpitations.

## 2023-12-29 ENCOUNTER — Encounter (HOSPITAL_BASED_OUTPATIENT_CLINIC_OR_DEPARTMENT_OTHER): Payer: Self-pay | Admitting: Family

## 2024-01-20 ENCOUNTER — Ambulatory Visit (HOSPITAL_BASED_OUTPATIENT_CLINIC_OR_DEPARTMENT_OTHER): Payer: PPO

## 2024-01-20 DIAGNOSIS — I491 Atrial premature depolarization: Secondary | ICD-10-CM | POA: Diagnosis not present

## 2024-01-21 LAB — ECHOCARDIOGRAM COMPLETE
AR max vel: 2.52 cm2
AV Area VTI: 2.51 cm2
AV Area mean vel: 2.25 cm2
AV Mean grad: 4.5 mmHg
AV Peak grad: 7.5 mmHg
Ao pk vel: 1.37 m/s
Area-P 1/2: 4.19 cm2
S' Lateral: 2.06 cm

## 2024-01-24 ENCOUNTER — Encounter (HOSPITAL_BASED_OUTPATIENT_CLINIC_OR_DEPARTMENT_OTHER): Payer: Self-pay

## 2024-02-17 DIAGNOSIS — R002 Palpitations: Secondary | ICD-10-CM

## 2024-02-27 ENCOUNTER — Encounter: Payer: Self-pay | Admitting: Internal Medicine

## 2024-02-27 DIAGNOSIS — R739 Hyperglycemia, unspecified: Secondary | ICD-10-CM | POA: Insufficient documentation

## 2024-02-27 DIAGNOSIS — I471 Supraventricular tachycardia, unspecified: Secondary | ICD-10-CM | POA: Insufficient documentation

## 2024-02-27 NOTE — Progress Notes (Unsigned)
 Subjective:    Patient ID: Jaime Owens, female    DOB: [birth year], 68 y.o.   MRN: [id number]      HPI Jaime Owens is here for a Physical exam and her chronic medical problems.    PVC's  - went to ED- saw cardiology.  Work up with PACs and paroxysmal SVT.  Had an episode yesterday - intermittent all day - stopped in evening.  Her watch said afib.  She did have < 1% of her rhythms aflutter on her holter done by cardiology.   Toe nail fungus on two nails-has done over-the-counter medications, but interested in something that may help that is not dangerous   Medications and allergies reviewed with patient and updated if appropriate.  Current Outpatient Medications on File Prior to Visit  Medication Sig Dispense Refill   acetaminophen  (TYLENOL ) 650 MG CR tablet Take 650-1,300 mg by mouth every 8 (eight) hours as needed for pain.     Ascorbic Acid (VITAMIN C PO)      Bilberry, Vaccinium myrtillus, (BILBERRY PO)      CALCIUM PO Take by mouth. Vitamin Code Raw Calcium   2 capsules     Caraway Oil-Levomenthol (FDGARD PO) Take by mouth daily.     CRANBERRY PO Take by mouth daily. Zazzee Organic Cranberry     Menthol-Camphor (TIGER BALM ARTHRITIS RUB EX) Apply 1 application topically daily as needed (for pain).     metoprolol  tartrate (LOPRESSOR ) 25 MG tablet Take 0.5-1 tablets (12.5-25 mg total) by mouth 2 (two) times daily as needed for palpitations. 30 tablet 2   Multiple Vitamins-Minerals (WOMENS MULTIVITAMIN PO) Take by mouth daily. Garden of Life Multivitamin + Probiotic     Multiple Vitamins-Minerals (ZINC PO) Take by mouth. Vitamin Code Raw Zinc  1 capsule     UNABLE TO FIND Take by mouth daily. Med Name: Zazzee Beet Root extract     UNABLE TO FIND Take by mouth. Med Name: Life Extension Macuguard Ocular Support   1 soft gel     Ginger, Zingiber officinalis, (GINGER PO) Take by mouth. (Patient not taking: Reported on 02/28/2024)     No current facility-administered medications on  file prior to visit.    Review of Systems  Constitutional:  Negative for fever.  Eyes:  Negative for visual disturbance.  Respiratory:  Positive for shortness of breath (with palpitations while doing certain actiivty). Negative for cough and wheezing.   Cardiovascular:  Positive for chest pain (occ twinge, little pain with irregular beats) and palpitations. Negative for leg swelling.  Gastrointestinal:  Negative for abdominal pain, blood in stool, constipation and diarrhea.       Occ gerd  Genitourinary:  Negative for dysuria.  Musculoskeletal:  Positive for arthralgias (mild hands, left knee) and back pain (occ with yard work).  Skin:  Negative for rash.       Onychomycosis toenails  Neurological:  Negative for light-headedness and headaches.  Psychiatric/Behavioral:  Negative for dysphoric mood and sleep disturbance. The patient is not nervous/anxious.        Objective:   Vitals:   02/28/24 1405  BP: 136/84  Pulse: 79  Temp: 98.3 F (36.8 C)  SpO2: 96%   Filed Weights   02/28/24 1405  Weight: 174 lb (78.9 kg)   Body mass index is 28.96 kg/m.  BP Readings from Last 3 Encounters:  02/28/24 136/84  12/23/23 128/69  11/07/23 124/71    Wt Readings from Last 3 Encounters:  02/28/24 174 lb (78.9  kg)  12/23/23 174 lb 1.6 oz (79 kg)  11/07/23 174 lb (78.9 kg)       Physical Exam Constitutional: She appears well-developed and well-nourished. No distress.  HENT:  Head: Normocephalic and atraumatic.  Right Ear: External ear normal. Normal ear canal and TM Left Ear: External ear normal.  Normal ear canal and TM Mouth/Throat: Oropharynx is clear and moist.  Eyes: Conjunctivae normal.  Neck: Neck supple. No tracheal deviation present. No thyromegaly present.  No carotid bruit  Cardiovascular: Normal rate, regular rhythm and normal heart sounds.   No murmur heard.  No edema. Pulmonary/Chest: Effort normal and breath sounds normal. No respiratory distress. She has no  wheezes. She has no rales.  Breast: deferred   Abdominal: Soft. She exhibits no distension. There is no tenderness.  Lymphadenopathy: She has no cervical adenopathy.  Skin: Skin is warm and dry. She is not diaphoretic.  Mild thickening, discoloration of some toenail- Psychiatric: She has a normal mood and affect. Her behavior is normal.     Lab Results  Component Value Date   WBC 6.1 10/19/2023   HGB 13.8 10/19/2023   HCT 41.4 10/19/2023   PLT 246 10/19/2023   GLUCOSE 100 (H) 10/19/2023   CHOL 227 (H) 10/21/2020   TRIG 113 10/21/2020   HDL 90 10/21/2020   LDLCALC 118 (H) 10/21/2020   ALT 16 01/11/2022   AST 16 01/11/2022   NA 135 10/19/2023   K 3.8 10/19/2023   CL 100 10/19/2023   CREATININE 0.81 10/19/2023   BUN 12 10/19/2023   CO2 27 10/19/2023   TSH 1.659 10/19/2023   HGBA1C 5.8 (H) 10/21/2020         Assessment & Plan:   Physical exam: Screening blood work  ordered Exercise  walking, yoga, stretching Weight  is ok Substance abuse  none   Reviewed recommended immunizations.   Health Maintenance  Topic Date Due   Medicare Annual Wellness (AWV)  Never done   Hepatitis C Screening  Never done   DTaP/Tdap/Td (1 - Tdap) Never done   Colonoscopy  Never done   Pneumonia Vaccine 59+ Years old (1 of 1 - PCV) Never done   MAMMOGRAM  Never done   Zoster Vaccines- Shingrix (1 of 2) Never done   DEXA SCAN  Never done   COVID-19 Vaccine (1 - 2024-25 season) 03/14/2024 (Originally 07/03/2023)   INFLUENZA VACCINE  06/01/2024   HPV VACCINES  Aged Out   Meningococcal B Vaccine  Aged Out          See Problem List for Assessment and Plan of chronic medical problems.

## 2024-02-27 NOTE — Patient Instructions (Addendum)

## 2024-02-28 ENCOUNTER — Other Ambulatory Visit (HOSPITAL_BASED_OUTPATIENT_CLINIC_OR_DEPARTMENT_OTHER): Payer: Self-pay

## 2024-02-28 ENCOUNTER — Ambulatory Visit (INDEPENDENT_AMBULATORY_CARE_PROVIDER_SITE_OTHER): Payer: PPO | Admitting: Internal Medicine

## 2024-02-28 VITALS — BP 136/84 | HR 79 | Temp 98.3°F | Ht 65.0 in | Wt 174.0 lb

## 2024-02-28 DIAGNOSIS — E042 Nontoxic multinodular goiter: Secondary | ICD-10-CM | POA: Diagnosis not present

## 2024-02-28 DIAGNOSIS — I471 Supraventricular tachycardia, unspecified: Secondary | ICD-10-CM

## 2024-02-28 DIAGNOSIS — B351 Tinea unguium: Secondary | ICD-10-CM | POA: Diagnosis not present

## 2024-02-28 DIAGNOSIS — Z0000 Encounter for general adult medical examination without abnormal findings: Secondary | ICD-10-CM

## 2024-02-28 DIAGNOSIS — R739 Hyperglycemia, unspecified: Secondary | ICD-10-CM | POA: Diagnosis not present

## 2024-02-28 MED ORDER — EFINACONAZOLE 10 % EX SOLN
CUTANEOUS | 1 refills | Status: AC
  Filled 2024-02-28: qty 4, 30d supply, fill #0

## 2024-02-28 MED ORDER — TRIAMCINOLONE ACETONIDE 0.1 % EX CREA
1.0000 | TOPICAL_CREAM | Freq: Two times a day (BID) | CUTANEOUS | 0 refills | Status: AC
  Filled 2024-02-28: qty 30, 15d supply, fill #0

## 2024-02-28 NOTE — Assessment & Plan Note (Addendum)
 Chronic Lab Results  Component Value Date   HGBA1C 5.8 (H) 10/21/2020   Will hold off on blood work and check at her next visit Low sugar / carb diet Stressed regular exercise

## 2024-02-28 NOTE — Assessment & Plan Note (Signed)
 Chronic Monitored by Dr. Rosalea Collin

## 2024-02-28 NOTE — Assessment & Plan Note (Signed)
 Subacute Has been treating this with over-the-counter medications and it has helped, but she is not able to get rid of it Trial of Jublia-apply daily x 48 weeks

## 2024-02-28 NOTE — Assessment & Plan Note (Addendum)
 Chronic Following with cardiology Has some PSVT, PACs and possibly PVCs Her Apple Watch at home did record atrial fibrillation yesterday Taking metoprolol  12.5-25 mg twice daily as needed for palpitations-she has not taken this yet Advised her to continue to monitor and contact cardiology based on her symptoms and her iWatch states

## 2024-04-05 DIAGNOSIS — H5310 Unspecified subjective visual disturbances: Secondary | ICD-10-CM | POA: Diagnosis not present

## 2024-04-05 DIAGNOSIS — H524 Presbyopia: Secondary | ICD-10-CM | POA: Diagnosis not present

## 2024-04-05 DIAGNOSIS — H2513 Age-related nuclear cataract, bilateral: Secondary | ICD-10-CM | POA: Diagnosis not present

## 2024-04-17 DIAGNOSIS — Z86018 Personal history of other benign neoplasm: Secondary | ICD-10-CM | POA: Diagnosis not present

## 2024-04-17 DIAGNOSIS — L820 Inflamed seborrheic keratosis: Secondary | ICD-10-CM | POA: Diagnosis not present

## 2024-04-17 DIAGNOSIS — D229 Melanocytic nevi, unspecified: Secondary | ICD-10-CM | POA: Diagnosis not present

## 2024-04-17 DIAGNOSIS — L821 Other seborrheic keratosis: Secondary | ICD-10-CM | POA: Diagnosis not present

## 2024-04-17 DIAGNOSIS — L814 Other melanin hyperpigmentation: Secondary | ICD-10-CM | POA: Diagnosis not present

## 2024-04-17 DIAGNOSIS — L578 Other skin changes due to chronic exposure to nonionizing radiation: Secondary | ICD-10-CM | POA: Diagnosis not present

## 2024-04-17 DIAGNOSIS — D1801 Hemangioma of skin and subcutaneous tissue: Secondary | ICD-10-CM | POA: Diagnosis not present

## 2024-05-22 ENCOUNTER — Ambulatory Visit: Payer: PPO | Admitting: Internal Medicine

## 2024-05-22 ENCOUNTER — Encounter: Payer: Self-pay | Admitting: Internal Medicine

## 2024-05-22 VITALS — BP 134/82 | HR 78 | Ht 65.0 in | Wt 173.0 lb

## 2024-05-22 DIAGNOSIS — E042 Nontoxic multinodular goiter: Secondary | ICD-10-CM

## 2024-05-22 LAB — T3, FREE: T3, Free: 3.4 pg/mL (ref 2.3–4.2)

## 2024-05-22 LAB — T4, FREE: Free T4: 1.3 ng/dL (ref 0.8–1.8)

## 2024-05-22 LAB — TSH: TSH: 1.06 m[IU]/L (ref 0.40–4.50)

## 2024-05-22 NOTE — Progress Notes (Unsigned)
 Name: Jaime Owens  MRN/ DOB: [id number], [birth year]    Age/ Sex: 68 y.o., female    PCP: Geofm Glade PARAS, MD   Reason for Endocrinology Evaluation: MNG     Date of Initial Endocrinology Evaluation: 12/10/2020    HPI: Jaime Owens is a 68 y.o. female with a past medical history of MNG. The patient presented for initial endocrinology clinic visit on 12/10/2020 for consultative assistance with her MNG/Hypothyroidism .   Pt has been diagnosed with MNG   At the age of 9. She had a benign FNA > 30 yrs ago. An ultrasound in 2016 revealed a left thyroid  nodule at 2.3 cm and a right thyroid  nodule at 2 cm.  She is S/P benign FNA of both right and left dominant thyroid  nodules on 04/30/2015. She is S/P calcified left thyroid  nodule 1.1  cm with benign cytology in 2018   She had a prescription for levothyroxine that she did not start in the past, her TSH was normal at 1.6 uIU/mL on her initial visit to our clinic and we opted not to start  No prior radiation exposure    Mother with goiter    She was told in the past she had high calcium and to stop calcium, labs at our office indicated normal PTH at 24 PG/mL, normal calcium at 9.9 mg/DL (albumin 5.6H/IO)     SUBJECTIVE:    Today (05/22/24): Jaime Owens is here for follow-up on multinodular goiter .   Has palpitations  No local neck swelling  Denies tremors  Denies diarrhea or loose stools  No Biotin     HISTORY:  Past Medical History:  Past Medical History:  Diagnosis Date   Acid reflux    Bladder infection    Chronic cough    Thyroid  goiter    Past Surgical History:  Past Surgical History:  Procedure Laterality Date   NO PAST SURGERIES      Social History:  reports that she has quit smoking. Her smoking use included cigarettes. She has never used smokeless tobacco. She reports current alcohol use. She reports that she does not use drugs. Family History: family history includes Cancer in an other family  member; Colon cancer in her paternal aunt; Diabetes in her father; Diverticulosis in her mother; Heart disease in her maternal grandmother and mother; Hypertension in an other family member; Other in her mother.   HOME MEDICATIONS: Allergies as of 05/22/2024       Reactions   Codeine Nausea Only   Doxycycline  Hyclate    Flagyl [metronidazole Hcl] Other (See Comments)   Burning sensation   Penicillins Other (See Comments)   Unknown Has patient had a PCN reaction causing immediate rash, facial/tongue/throat swelling, SOB or lightheadedness with hypotension: Unknown Has patient had a PCN reaction causing severe rash involving mucus membranes or skin necrosis: Unknown Has patient had a PCN reaction that required hospitalization: Unknown Has patient had a PCN reaction occurring within the last 10 years: No If all of the above answers are NO, then may proceed with Cephalosporin use.   Sulfamethoxazole-trimethoprim Nausea Only        Medication List        Accurate as of May 22, 2024 11:17 AM. If you have any questions, ask your nurse or doctor.          acetaminophen  650 MG CR tablet Commonly known as: TYLENOL  Take 650-1,300 mg by mouth every 8 (eight) hours as needed for pain.  BILBERRY PO   CALCIUM PO Take by mouth. Vitamin Code Raw Calcium   2 capsules   CRANBERRY PO Take by mouth daily. Zazzee Organic Cranberry   Efinaconazole  10 % Soln Apply daily for 48 weeks   FDGARD PO Take by mouth daily.   FLAXSEED OIL PO Take by mouth. Patient takes Naturewise brand   GINGER PO Take by mouth.   HAWTHORNE BERRY PO Take 600 mg by mouth 3 (three) times daily. Patient takes Veterinary surgeon; One dropper up to 3 times daily (600 mg per dropper)   metoprolol  tartrate 25 MG tablet Commonly known as: LOPRESSOR  Take 0.5-1 tablets (12.5-25 mg total) by mouth 2 (two) times daily as needed for palpitations.   Peppermint Spirit (Bulk) Sprt 584 mg by Does not  apply route 3 (three) times daily. Patient uses [employer] Peppermint Spirts; One dropper full 2-3 times daily   QUERCETIN PO Take 2 capsules by mouth 2 (two) times daily. Patient takes Kappa Nutrition Quercetin Plus   TIGER BALM ARTHRITIS RUB EX Apply 1 application topically daily as needed (for pain).   triamcinolone  cream 0.1 % Commonly known as: KENALOG  Apply 1 Application topically 2 (two) times daily.   UNABLE TO FIND Take by mouth daily. Med Name: Zazzee Beet Root extract   UNABLE TO FIND Take by mouth. Med Name: Life Extension Macuguard Ocular Support   1 soft gel   VITAMIN C PO   WOMENS MULTIVITAMIN PO Take by mouth daily. Garden of Life Multivitamin + Probiotic   ZINC PO Take by mouth. Vitamin Code Raw Zinc  1 capsule          REVIEW OF SYSTEMS: A comprehensive ROS was conducted with the patient and is negative except as per HPI     OBJECTIVE:  VS: BP 134/82 (BP Location: Left Arm, Patient Position: Sitting, Cuff Size: Normal)   Pulse 78   Ht 5' 5 (1.651 m)   Wt 173 lb (78.5 kg)   SpO2 97%   BMI 28.79 kg/m    Wt Readings from Last 3 Encounters:  05/22/24 173 lb (78.5 kg)  02/28/24 174 lb (78.9 kg)  12/23/23 174 lb 1.6 oz (79 kg)     EXAM: General: Pt appears well and is in NAD  Neck: General: Supple without adenopathy. Thyroid : Thyroid  size normal. Left asymmetry noted  Lungs: Clear with good BS bilat   Heart: Auscultation: RRR.  Abdomen: Soft, nontender  Extremities:  BL LE: No pretibial edema   Mental Status: Judgment, insight: Intact Orientation: Oriented to time, place, and person Mood and affect: No depression, anxiety, or agitation     DATA REVIEWED:    Latest Reference Range & Units 05/22/24 11:40  TSH 0.40 - 4.50 mIU/L 1.06  Triiodothyronine,Free,Serum 2.3 - 4.2 pg/mL 3.4  T4,Free(Direct) 0.8 - 1.8 ng/dL 1.3      Thyroid  Ultrasound 05/13/2022  Narrative & Impression  CLINICAL DATA:  Goiter. Reported history of  prior thyroid  nodule biopsy many years ago.   EXAM: THYROID  ULTRASOUND   TECHNIQUE: Ultrasound examination of the thyroid  gland and adjacent soft tissues was performed.   COMPARISON:  Prior thyroid  ultrasound 03/27/2015   FINDINGS: Parenchymal Echotexture: Moderately heterogenous   Isthmus: 0.1 cm   Right lobe: 5.7 x 1.8 x 2.1 cm   Left lobe: 5.3 x 2.2 x 2.6 cm   _________________________________________________________   Estimated total number of nodules >/= 1 cm: 5   Number of spongiform nodules >/=  2 cm not  described below (TR1): 0   Number of mixed cystic and solid nodules >/= 1.5 cm not described below (TR2): 0   _________________________________________________________   Nodule #1: Right mid thyroid  nodule measures approximately 1.8 x 1.4 x 1.1 cm which is slightly smaller compared to 2.0 x 1.6 x 1.2 cm in 2016. Involution over 5 years is consistent with a benign process.   Nodule # 3: Nodule in the right lower gland measures 1.4 x 1.3 x 1.1 cm compared to 1.8 x 1.4 x 1.2 cm in 2016. Involution over 5 years is consistent with a benign process.   Nodule # 4: Elongated nodule in the right mid gland measures 1.4 x 1.2 x 0.7 cm, insignificantly changed compared to 1.3 x 1.1 x 0.7 cm in 2016.   Nodule # 6: Solid nodule in the inferior aspect of the left thyroid  gland measures 2.4 x 2.2 x 1.5 cm, insignificantly changed compared to 2.3 x 2.3 x 1.5 cm in 2016.   No new nodules or suspicious features.   IMPRESSION: 1. There is been no significant interval change in the size, appearance or character of multiple bilateral thyroid  nodules dating back to May of 2016. Greater than 5 year stability is consistent with benignity. 2. No new nodules or suspicious features.      FNA left thyroid  nodule 01/25/2017  Negative for malignant cells, Bet[place]day Category II ,consistent with benign follicular cells.   ASSESSMENT/PLAN/RECOMMENDATIONS:   Multinodular Goiter  :  - Pt is clinically euthyroid  - She had an  ultrasound in 2016 revealed a left thyroid  nodule at 2.3 cm and a right thyroid  nodule at 2 cm.  - She is S/P benign FNA of both right and left dominant thyroid  nodules on 04/30/2015. - She is S/P FNA of calcified left thyroid  nodule 1.1  cm with benign cytology in 2018 -Repeat ultrasound 05/2022 shows stability or involution of the thyroid  nodules and NO  further ultrasounds were recommended due to 5-year stability. - Patient will be monitored clinically either through our office or PCPs office - I did discuss the option of radiofrequency ablation with the patient in case her nodules become symptomatic   F/U in as needed   Signed electronically by: Stefano Redgie Butts, MD  [facility] Endocr[facility]  [facility] [address] [place] [place], [place] [address] Phone: [phone] FAX: [phone]   CC: Geofm Glade PARAS, MD [address] [place] [address] Phone: [phone] Fax: [phone]   Return to Endocrinology clinic as below: Future Appointments  Date Time Provider Department Center  06/20/2024  1:20 PM Lonni Slain, MD [facility]  03/01/2025  1:00 PM Geofm, Glade PARAS, MD [facility] None

## 2024-05-23 ENCOUNTER — Ambulatory Visit: Payer: Self-pay | Admitting: Internal Medicine

## 2024-06-19 NOTE — Progress Notes (Signed)
  Cardiology Office Note:  .   Date:  06/20/2024  ID:  Jaime Owens, DOB 1956/08/26, MRN [id number] PCP: Geofm Glade PARAS, MD  [facility] Providers Cardiologist:  Shelda Bruckner, MD {  History of Present Illness: .   Jaime Owens is a 68 y.o. female with history of atypical chest pain, palpitations. She was previously followed by Dr. Hobart and established care with me on 11/07/23.  Pertinent CV history: seen for atypical chest pain in 2021. Echo normal. Seen again 05/2023 for irregular heart beat noted on physical exam by her OBGYN. Noted family history of CAD. Declined Ca score due to cost. Ordered for ETT for atypical chest pain in 2024 but not pursued by patient. Zio monitor 2025 with brief (2 minutes)/rare atrial flutter. Echo unremarkable.  Today: Feeling great. Brings strip from 5/19 from her apple watch, was feeling a flutter. Read as afib, but visually reviewed, was sinus with frequent PACs. Has had two episodes of severe vertigo, no clear triggers. Happened about three weeks apart (July 13, Aug 8). No congestion, illness, etc.   Has taken metoprolol  once since last visit. Feels that her regimen is managing symptoms well. Trying to get back into walking daily. Walked two miles this morning.  Frequently had days where she felt like she couldn't even put her bra on because it was constricting, but now doing fine. Doesn't limit activity.   ROS: Denies shortness of breath at rest or with normal exertion. No PND, orthopnea, LE edema or unexpected weight gain. No syncope. ROS otherwise negative except as noted.   Studies Reviewed: SABRA    EKG:       Physical Exam:   VS:  BP (!) 140/90   Pulse 70   Ht 5' 4 (1.626 m)   Wt 173 lb 6.4 oz (78.7 kg)   SpO2 96%   BMI 29.76 kg/m    Wt Readings from Last 3 Encounters:  06/20/24 173 lb 6.4 oz (78.7 kg)  05/22/24 173 lb (78.5 kg)  02/28/24 174 lb (78.9 kg)    GEN: Well nourished, well developed in no acute distress HEENT:  Normal, moist mucous membranes NECK: No JVD CARDIAC: regular rhythm, normal S1 and S2, no rubs or gallops. No murmur. VASCULAR: Radial and DP pulses 2+ bilaterally. No carotid bruits RESPIRATORY:  Clear to auscultation without rales, wheezing or rhonchi  ABDOMEN: Soft, non-tender, non-distended MUSCULOSKELETAL:  Ambulates independently SKIN: Warm and dry, no edema NEUROLOGIC:  Alert and oriented x 3. No focal neuro deficits noted. PSYCHIATRIC:  Normal affect    ASSESSMENT AND PLAN: .    Palpitations PVCs, PACs -reviewed her ER testing. Thyroid , electrolytes, hsTn normal. ECG NSR -reviewed Zio monitor and echo. Brief atrial flutter (longest 2 minutes) -she would like to avoid medications if possible. Has PRN metoprolol  -improved with cutting back caffeine -managing with OTC supplements  Elevated blood pressure reading without hypertension -has cut back on caffeine -home readings normal  Atypical chest pain: -reviewed red flag warning signs that need immediate medical attentions  Family history of CAD: -declined statin, Ca score  Dispo: 1 year or sooner as needed  Signed, Shelda Bruckner, MD   Shelda Bruckner, MD, PhD, [facility] [place]  [facility]  [place]  [facility] at [facility] at [facility] [address], [address] [place], [place] [address] [phone]

## 2024-06-20 ENCOUNTER — Ambulatory Visit (HOSPITAL_BASED_OUTPATIENT_CLINIC_OR_DEPARTMENT_OTHER): Admitting: Cardiology

## 2024-06-20 ENCOUNTER — Encounter (HOSPITAL_BASED_OUTPATIENT_CLINIC_OR_DEPARTMENT_OTHER): Payer: Self-pay | Admitting: Cardiology

## 2024-06-20 VITALS — BP 140/90 | HR 70 | Ht 64.0 in | Wt 173.4 lb

## 2024-06-20 DIAGNOSIS — Z8249 Family history of ischemic heart disease and other diseases of the circulatory system: Secondary | ICD-10-CM

## 2024-06-20 DIAGNOSIS — R030 Elevated blood-pressure reading, without diagnosis of hypertension: Secondary | ICD-10-CM | POA: Diagnosis not present

## 2024-06-20 DIAGNOSIS — I493 Ventricular premature depolarization: Secondary | ICD-10-CM | POA: Diagnosis not present

## 2024-06-20 DIAGNOSIS — R002 Palpitations: Secondary | ICD-10-CM

## 2024-06-20 DIAGNOSIS — R0789 Other chest pain: Secondary | ICD-10-CM | POA: Diagnosis not present

## 2024-06-20 DIAGNOSIS — I491 Atrial premature depolarization: Secondary | ICD-10-CM

## 2024-06-20 NOTE — Patient Instructions (Signed)
 Medication Instructions:  Your physician recommends that you continue on your current medications as directed. Please refer to the Current Medication list given to you today.  *If you need a refill on your cardiac medications before your next appointment, please call your pharmacy*  Lab Work: NONE  Testing/Procedures: NONE  Follow-Up: At [facility], you and your health needs are our priority.  As part of our continuing mission to provide you with exceptional heart care, we have created designated Provider Care Teams.  These Care Teams include your primary Cardiologist (physician) and Advanced Practice Providers (APPs -  Physician Assistants and Nurse Practitioners) who all work together to provide you with the care you need, when you need it.  We recommend signing up for the patient portal called MyChart.  Sign up information is provided on this After Visit Summary.  MyChart is used to connect with patients for Virtual Visits (Telemedicine).  Patients are able to view lab/test results, encounter notes, upcoming appointments, etc.  Non-urgent messages can be sent to your provider as well.   To learn more about what you can do with MyChart, go to [email or web address].    Your next appointment:   12 month(s)  The format for your next appointment:   In Person  Provider:   Dr Lonni, Reche ORN NP, or Rosaline RAMAN NP

## 2024-09-04 DIAGNOSIS — D23121 Other benign neoplasm of skin of left upper eyelid, including canthus: Secondary | ICD-10-CM | POA: Diagnosis not present

## 2025-03-01 ENCOUNTER — Encounter: Admitting: Internal Medicine
